# Patient Record
Sex: Male | Born: 1945 | Race: White | Hispanic: No | Marital: Married | Smoking: Former smoker
Health system: Southern US, Community
[De-identification: ages and names within clinical notes are randomized; demographics above are authoritative.]

## PROBLEM LIST (undated history)

## (undated) DIAGNOSIS — R9439 Abnormal result of other cardiovascular function study: Secondary | ICD-10-CM

## (undated) DIAGNOSIS — E119 Type 2 diabetes mellitus without complications: Secondary | ICD-10-CM

## (undated) DIAGNOSIS — F419 Anxiety disorder, unspecified: Secondary | ICD-10-CM

## (undated) DIAGNOSIS — E782 Mixed hyperlipidemia: Secondary | ICD-10-CM

## (undated) DIAGNOSIS — G08 Intracranial and intraspinal phlebitis and thrombophlebitis: Secondary | ICD-10-CM

## (undated) DIAGNOSIS — G8929 Other chronic pain: Secondary | ICD-10-CM

## (undated) DIAGNOSIS — Z87448 Personal history of other diseases of urinary system: Secondary | ICD-10-CM

## (undated) DIAGNOSIS — I639 Cerebral infarction, unspecified: Secondary | ICD-10-CM

## (undated) HISTORY — DX: Cerebral infarction, unspecified: I63.9

## (undated) HISTORY — PX: COLONOSCOPY: SHX174

## (undated) HISTORY — DX: Personal history of other diseases of urinary system: Z87.448

## (undated) HISTORY — DX: Other chronic pain: G89.29

## (undated) HISTORY — DX: Type 2 diabetes mellitus without complications: E11.9

## (undated) HISTORY — DX: Anxiety disorder, unspecified: F41.9

## (undated) HISTORY — DX: Mixed hyperlipidemia: E78.2

## (undated) HISTORY — DX: Intracranial and intraspinal phlebitis and thrombophlebitis: G08

## (undated) HISTORY — DX: Abnormal result of other cardiovascular function study: R94.39

---

## 2004-08-01 HISTORY — PX: OTHER SURGICAL HISTORY: SHX169

## 2011-08-03 ENCOUNTER — Encounter: Payer: Self-pay | Admitting: Family Medicine

## 2011-10-05 ENCOUNTER — Encounter: Payer: Self-pay | Admitting: Family Medicine

## 2011-10-05 DIAGNOSIS — R079 Chest pain, unspecified: Secondary | ICD-10-CM

## 2011-11-14 ENCOUNTER — Encounter: Payer: Self-pay | Admitting: Family Medicine

## 2011-11-14 DIAGNOSIS — R55 Syncope and collapse: Secondary | ICD-10-CM

## 2011-11-22 ENCOUNTER — Encounter: Payer: Self-pay | Admitting: *Deleted

## 2011-12-08 ENCOUNTER — Encounter: Payer: Self-pay | Admitting: Cardiology

## 2011-12-09 ENCOUNTER — Encounter: Payer: Self-pay | Admitting: Cardiology

## 2011-12-09 ENCOUNTER — Ambulatory Visit (INDEPENDENT_AMBULATORY_CARE_PROVIDER_SITE_OTHER): Payer: Medicare PPO | Admitting: Cardiology

## 2011-12-09 VITALS — BP 113/74 | HR 84 | Ht 72.0 in | Wt 197.0 lb

## 2011-12-09 DIAGNOSIS — E119 Type 2 diabetes mellitus without complications: Secondary | ICD-10-CM | POA: Insufficient documentation

## 2011-12-09 DIAGNOSIS — E782 Mixed hyperlipidemia: Secondary | ICD-10-CM

## 2011-12-09 DIAGNOSIS — Z8673 Personal history of transient ischemic attack (TIA), and cerebral infarction without residual deficits: Secondary | ICD-10-CM

## 2011-12-09 DIAGNOSIS — I2510 Atherosclerotic heart disease of native coronary artery without angina pectoris: Secondary | ICD-10-CM

## 2011-12-09 DIAGNOSIS — Z136 Encounter for screening for cardiovascular disorders: Secondary | ICD-10-CM

## 2011-12-09 MED ORDER — NITROGLYCERIN 0.4 MG SL SUBL: 0.4000 mg | SUBLINGUAL_TABLET | SUBLINGUAL | Status: AC | PRN

## 2011-12-09 NOTE — Assessment & Plan Note (Signed)
Diagnosis based on abnormal Myoview showing lateral ischemia although normal LVEF. In the absence of progressive angina or shortness of breath, our plan is to continue observation with risk factor modification. Aspirin as not being added to his current Coumadin with history of intermittent hematuria. We are providing a prescription for p.r.n. Nitroglycerin. Otherwise focus on glucose and lipid management with Dr. Leandrew Koyanagi. Follow up arranged in 6 months, sooner if needed.

## 2011-12-09 NOTE — Patient Instructions (Addendum)
Your physician wants you to follow-up in: 6 months. You will receive a reminder letter in the mail one-two months in advance. If you don't receive a letter, please call our office to schedule the follow-up appointment. Your physician recommends that you continue on your current medications as directed. Please refer to the Current Medication list given to you today. Use Nitroglycerin as needed for chest pain--Place one tablet under tongue every 5 minutes up to 3 doses as needed for chest pain. No more than 3 doses over a 15 minute period.

## 2011-12-09 NOTE — Assessment & Plan Note (Signed)
Details not clear. Patient reports recurrent strokes over time, necessitating chronic Coumadin.

## 2011-12-09 NOTE — Assessment & Plan Note (Signed)
Keep follow up with Dr. Leandrew Koyanagi, aim for hemoglobin A1c at least under 7.

## 2011-12-09 NOTE — Assessment & Plan Note (Signed)
Continue on statin therapy, goal LDL should be under 100.

## 2011-12-09 NOTE — Progress Notes (Signed)
Clinical Summary  Mr. Joel Santos is a 66 y.o.male referred by Dr. Leandrew Koyanagi for cardiology consultation. Recent testing reviewed with patient today. He denies any clear-cut angina or progressive shortness of breath. States he has had some trouble with dizziness and reported syncopal events, evaluated earlier in the year. Feels generally fatigued.  Echocardiogram from March demonstrated LVEF of 60-65% with diastolic dysfunction, mild biatrial enlargement, mild mitral regurgitation, mild pulmonary hypertension with RVSP 40-45 mmHg, mildly dilated ascending aortic root. Carotid Dopplers from March showed only mild plaque with no hemodynamically significant stenoses. Myoview from March as well indicated no diagnostic ST segment changes with Lexiscan, and evidence of probable ischemia in the lateral wall.  We discussed the likelihood that he has underlying CAD based on this testing. Also discussed importance of good control of his diabetes and lipid status. He reports having some occasional hematuria on Coumadin, and therefore we felt it best not to add aspirin to his regimen. Discussed options of continuing medical therapy and observation, versus pursuing cardiac catheterization if his symptoms were to progress. This would necessitate discontinuation of Coumadin at least in the short-term, and would have a higher risk of stroke in light of his history. For now he was most comfortable with observation.   No Known Allergies  Current Outpatient Prescriptions  Medication Sig Dispense Refill  . diazepam (VALIUM) 5 MG tablet Take 5 mg by mouth 2 (two) times daily.      Marland Kitchen gabapentin (NEURONTIN) 800 MG tablet Take 800 mg by mouth 4 (four) times daily.      Marland Kitchen glipiZIDE (GLUCOTROL) 5 MG tablet Take 2.5 mg by mouth 2 (two) times daily before a meal.      . mirtazapine (REMERON) 30 MG tablet Take 15 mg by mouth at bedtime.      Marland Kitchen oxyCODONE-acetaminophen (PERCOCET) 5-325 MG per tablet Take 1 tablet by mouth every 6  (six) hours as needed.      . simvastatin (ZOCOR) 80 MG tablet Take 40 mg by mouth at bedtime.      Marland Kitchen venlafaxine XR (EFFEXOR-XR) 150 MG 24 hr capsule Take 300 mg by mouth daily.      Marland Kitchen warfarin (COUMADIN) 5 MG tablet Take 5 mg by mouth as directed.      . nitroGLYCERIN (NITROSTAT) 0.4 MG SL tablet Place 1 tablet (0.4 mg total) under the tongue every 5 (five) minutes as needed for chest pain.  25 tablet  3    Past Medical History  Diagnosis Date  . Mixed hyperlipidemia   . Stroke     Recurrent by report - although head MRI negative 2009  . Type 2 diabetes mellitus   . Anxiety   . Chronic pain   . Cerebral venous thrombosis     MR venogram with patent dural sinuses 2009    Past Surgical History  Procedure Date  . Arm surgery 2006    Family History  Problem Relation Age of Onset  . Cancer Mother   . Diabetes Mother   . Coronary artery disease Mother     MI age 42    Social History Mr. Joel Santos reports that he quit smoking about 16 years ago. His smoking use included Cigarettes. He has a 90 pack-year smoking history. He quit smokeless tobacco use about 13 years ago. His smokeless tobacco use included Chew. Mr. Joel Santos reports that he does not drink alcohol.  Review of Systems No palpitations, no recent syncope. Stable appetite. No orthopnea or lower extremity edema. Otherwise  negative except as outlined.  Physical Examination Filed Vitals:   12/09/11 1410  BP: 113/74  Pulse: 84   Overweight male in no acute distress. HEENT: Conjunctiva and lids normal, oropharynx clear. Neck: Supple, no elevated JVP, soft left carotid bruit, no thyromegaly. Lungs: Clear to auscultation, nonlabored breathing at rest. Cardiac: Regular rate and rhythm, no S3 or significant systolic murmur, no pericardial rub. Abdomen: Soft, nontender, bowel sounds present, no guarding or rebound. Extremities: No pitting edema, distal pulses 1-2+. Right hand deformity after prior injury. Skin: Warm and  dry. Musculoskeletal: No kyphosis. Neuropsychiatric: Alert and oriented x3, affect grossly appropriate.   ECG Normal sinus rhythm at 79.   Problem List and Plan   Coronary atherosclerosis of native coronary artery Diagnosis based on abnormal Myoview showing lateral ischemia although normal LVEF. In the absence of progressive angina or shortness of breath, our plan is to continue observation with risk factor modification. Aspirin as not being added to his current Coumadin with history of intermittent hematuria. We are providing a prescription for p.r.n. Nitroglycerin. Otherwise focus on glucose and lipid management with Dr. Leandrew Koyanagi. Follow up arranged in 6 months, sooner if needed.  Mixed hyperlipidemia Continue on statin therapy, goal LDL should be under 100.  Type 2 diabetes mellitus Keep follow up with Dr. Leandrew Koyanagi, aim for hemoglobin A1c at least under 7.  History of stroke Details not clear. Patient reports recurrent strokes over time, necessitating chronic Coumadin.     Jonelle Sidle, M.D., F.A.C.C.

## 2015-04-01 ENCOUNTER — Encounter: Payer: Self-pay | Admitting: Internal Medicine

## 2015-04-23 ENCOUNTER — Ambulatory Visit: Payer: Medicare PPO | Admitting: Nurse Practitioner

## 2015-05-07 ENCOUNTER — Ambulatory Visit (INDEPENDENT_AMBULATORY_CARE_PROVIDER_SITE_OTHER): Payer: Medicare PPO | Admitting: Nurse Practitioner

## 2015-05-07 ENCOUNTER — Encounter: Payer: Self-pay | Admitting: Nurse Practitioner

## 2015-05-07 VITALS — BP 113/72 | HR 72 | Temp 97.3°F | Ht 73.0 in | Wt 173.6 lb

## 2015-05-07 DIAGNOSIS — K5900 Constipation, unspecified: Secondary | ICD-10-CM | POA: Insufficient documentation

## 2015-05-07 DIAGNOSIS — R1084 Generalized abdominal pain: Secondary | ICD-10-CM

## 2015-05-07 DIAGNOSIS — R109 Unspecified abdominal pain: Secondary | ICD-10-CM | POA: Insufficient documentation

## 2015-05-07 MED ORDER — LUBIPROSTONE 24 MCG PO CAPS
24.0000 ug | ORAL_CAPSULE | Freq: Two times a day (BID) | ORAL | Status: AC
Start: 2015-05-07 — End: ?

## 2015-05-07 NOTE — Progress Notes (Signed)
Primary Care Physician:  Juliette Alcide, MD Primary Gastroenterologist:  Dr. Jena Gauss  Chief Complaint  Patient presents with  . Abdominal Pain    HPI:   69 year old male presents on referral from PCP for abdominal pain. PCP notes reviewed. PCP visit dated 03/23/2015 for patient with a history of abdominal pain, seen in the ER 01/15/2015. Pain was described as upper half abdomen worse in the bilateral flanks, intermittent, worse with eating. Also noted hematochezia on the paper and streaked on stool. Also with weakness and dizziness. Abdominal ultrasound completed 03/26/2015 with no acute findings, hepatic cysts also identified on CT 1 month prior, gallbladder normal, no bile duct dilation. Multiple labs ordered with normal lipase, normal amylase, normal CMP, normal CBC with hemoglobin of 13.2. No CT scan results provided, none found in our system. Colonoscopy report received from [facility] and was completed on 01/14/2013. Polypectomy 2, internal hemorrhoids noted. Surgical pathology showed hyperplastic polyp. Recommended repeat in 5 years  Today he states his pain is periumbilical, upper abdomen, and bilateral flanks. Pain has been occurring for 3 years and is persistent but no worse. Admits occasional/rare N/V. Has been taking Miralax prn constipation. Pain is burning/irritating pain, constant unless he take one of his pain pills. Has a bowel movement about every 2-3 days, stools are hard and require straining. Post-bowel movement sensation of incomplete emptying. Pain worse after eating, improves after a bowel movement. Has had scant toilet-tissue hematochezia once in the past month when having a particularly bad episode of constipation. Denies chest pain, dizziness, lightheadedness, syncope, near syncope. Denies any other upper or lower GI symptoms.  Past Medical History  Diagnosis Date  . Mixed hyperlipidemia   . Stroke [facility])     Recurrent by report - although head MRI negative  2009  . Type 2 diabetes mellitus (HCC)   . Anxiety   . Chronic pain   . Cerebral venous thrombosis     MR venogram with patent dural sinuses 2009    Past Surgical History  Procedure Laterality Date  . Arm surgery  2006  . Colonoscopy  about 3 yrs ago    removed 2 cyst    Current Outpatient Prescriptions  Medication Sig Dispense Refill  . atorvastatin (LIPITOR) 20 MG tablet Take 20 mg by mouth daily.    . diazepam (VALIUM) 5 MG tablet Take 5 mg by mouth 2 (two) times daily.    Marland Kitchen gabapentin (NEURONTIN) 800 MG tablet Take 800 mg by mouth 4 (four) times daily.    Marland Kitchen glipiZIDE (GLUCOTROL) 5 MG tablet Take 2.5 mg by mouth 2 (two) times daily before a meal.    . lisinopril (PRINIVIL,ZESTRIL) 10 MG tablet Take 10 mg by mouth daily.    . mirtazapine (REMERON) 30 MG tablet Take 15 mg by mouth at bedtime.    Marland Kitchen oxyCODONE-acetaminophen (PERCOCET) 5-325 MG per tablet Take 1 tablet by mouth every 6 (six) hours as needed.    . simvastatin (ZOCOR) 80 MG tablet Take 40 mg by mouth at bedtime.    . sulfamethoxazole-trimethoprim (BACTRIM DS,SEPTRA DS) 800-160 MG tablet TK 1 T PO BID  0  . venlafaxine XR (EFFEXOR-XR) 150 MG 24 hr capsule Take 300 mg by mouth daily.    Marland Kitchen warfarin (COUMADIN) 5 MG tablet Take 5 mg by mouth as directed.    . nitroGLYCERIN (NITROSTAT) 0.4 MG SL tablet Place 1 tablet (0.4 mg total) under the tongue every 5 (five) minutes as needed for chest pain. 25 tablet  3   No current facility-administered medications for this visit.    Allergies as of 05/07/2015  . (No Known Allergies)    Family History  Problem Relation Age of Onset  . Cancer Mother   . Diabetes Mother   . Coronary artery disease Mother     MI age 53    Social History   Social History  . Marital Status: Married    Spouse Name: N/A  . Number of Children: N/A  . Years of Education: N/A   Occupational History  . Not on file.   Social History Main Topics  . Smoking status: Former Smoker -- 3.00  packs/day for 30 years    Types: Cigarettes    Quit date: 08/02/1995  . Smokeless tobacco: Former Neurosurgeon    Types: Chew    Quit date: 08/01/1998     Comment: chewed for three years  . Alcohol Use: No  . Drug Use: No  . Sexual Activity: Not on file   Other Topics Concern  . Not on file   Social History Narrative    Review of Systems: General: Negative for anorexia, weight loss, fever, chills, fatigue, weakness. Eyes: Negative for vision changes.  ENT: Negative for hoarseness, difficulty swallowing. CV: Negative for chest pain, angina, palpitations, peripheral edema.  Respiratory: Negative for dyspnea at rest, cough, sputum, wheezing.  GI: See history of present illness. Derm: Negative for rash or itching.  Endo: Negative for unusual weight change.  Heme: Negative for bruising or bleeding. Allergy: Negative for rash or hives.    Physical Exam: BP 113/72 mmHg  Pulse 72  Temp(Src) 97.3 F (36.3 C) (Oral)  Ht  (1.854 m)  Wt 173 lb 9.6 oz (78.744 kg)  BMI 22.91 kg/m2 General:   Alert and oriented. Pleasant and cooperative. Well-nourished and well-developed.  Head:  Normocephalic and atraumatic. Eyes:  Without icterus, sclera clear and conjunctiva pink.  Ears:  Normal auditory acuity. Cardiovascular:  S1, S2 present without murmurs appreciated. Extremities without clubbing or edema. Respiratory:  Clear to auscultation bilaterally. No wheezes, rales, or rhonchi. No distress.  Gastrointestinal:  +BS, soft, and non-distended. Mild generalized abdominal discomfort. No HSM noted. No guarding or rebound. No masses appreciated.  Rectal:  Deferred  Neurologic:  Alert and oriented x4;  grossly normal neurologically. Psych:  Alert and cooperative. Normal mood and affect. Heme/Lymph/Immune: No excessive bruising noted.    05/07/2015 9:46 AM

## 2015-05-07 NOTE — Patient Instructions (Signed)
1. Have your x-ray done. 2. Start taking Amitiza 24 g, one tab twice daily. Take it with food to prevent nausea side effect. 3. We will try to provide you with samples for 2-3 weeks. 4. Call with the results and if the Amitiza is working with her constipation. Tell us if your having more frequent bowel movements, feel like your emptying completely, and if your abdominal pain is any better. 5. Return for follow-up in 6 weeks.

## 2015-05-08 NOTE — Assessment & Plan Note (Signed)
Constipation symptoms noted in addition to his abdominal pain. His symptoms include a bowel movement every 2-3 days, hard stools, straining required, and sensation of incomplete emptying. MiraLAX is been ineffective and adequately controlling his symptoms. We'll trial him on Amitiza as noted below. Return for follow-up in 6 weeks.

## 2015-05-08 NOTE — Assessment & Plan Note (Addendum)
Patient with chronic generalized abdominal pain over the past 3 years. Pain is constant. He sees pain management. Tends to manage his pain adequately. However, his pain is worse after eating and is also associated with constipation-like symptoms as noted above. Pain is somewhat improved after bowel movement. 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 MiraLAX is Inadequate for his symptoms. Today I will order a flat plate abdomen to evaluate for stool burden. We'll also trial him on Amitiza 24 g twice a day to be taken with food, we'll provide samples for 2-3 weeks. He agrees to call us in 2-3 weeks with the results of the Amitiza including frequency of stools, stool consistency, and status of his abdominal pain. Return for follow-up in 6 weeks for further evaluation.

## 2015-05-11 NOTE — Progress Notes (Signed)
CC'D TO PCP °

## 2015-06-23 ENCOUNTER — Ambulatory Visit: Payer: Medicare PPO | Admitting: Nurse Practitioner

## 2016-02-04 ENCOUNTER — Encounter: Payer: Self-pay | Admitting: Cardiology

## 2016-02-04 ENCOUNTER — Encounter: Payer: Self-pay | Admitting: *Deleted

## 2016-02-04 ENCOUNTER — Ambulatory Visit (INDEPENDENT_AMBULATORY_CARE_PROVIDER_SITE_OTHER): Payer: Medicare PPO | Admitting: Cardiology

## 2016-02-04 VITALS — BP 92/54 | HR 76 | Ht 73.0 in | Wt 172.0 lb

## 2016-02-04 DIAGNOSIS — I25119 Atherosclerotic heart disease of native coronary artery with unspecified angina pectoris: Secondary | ICD-10-CM | POA: Diagnosis not present

## 2016-02-04 DIAGNOSIS — R0602 Shortness of breath: Secondary | ICD-10-CM

## 2016-02-04 DIAGNOSIS — R42 Dizziness and giddiness: Secondary | ICD-10-CM | POA: Diagnosis not present

## 2016-02-04 DIAGNOSIS — E785 Hyperlipidemia, unspecified: Secondary | ICD-10-CM

## 2016-02-04 DIAGNOSIS — Z8673 Personal history of transient ischemic attack (TIA), and cerebral infarction without residual deficits: Secondary | ICD-10-CM

## 2016-02-04 MED ORDER — LISINOPRIL 5 MG PO TABS: 5.0000 mg | ORAL_TABLET | Freq: Every day | ORAL | Status: DC

## 2016-02-04 NOTE — Progress Notes (Signed)
Cardiology Office Note  Date: 02/04/2016   ID: Joel SitesJackie Morella, DOB [birth year], MRN [id number]  PCP: Juliette AlcideBURDINE,STEVEN E, MD  Consulting Cardiologist: Nona DellSamuel Shavontae Gibeault, MD   Chief Complaint  Patient presents with  . Cardiac evaluation  . History of abnormal Cardiolite    History of Present Illness: Joel Santos is a 70 y.o. male referred back to the office by Dr. Leandrew KoyanagiBurdine, he was last seen in 2013. I reviewed his records. He presents with a fairly chronic history of dyspnea on exertion. Does not report any palpitations or exertional chest pain. He had a [facility] May reporting LVEF 55-60% with abnormal diastolic function (ungraded), mild left atrial enlargement, thickened aortic valve without stenosis, RVSP 49 mmHg. Reports occasional cough, nonproductive. No fevers or chills. Feels mildly dizzy sometimes when he stands.  He is on chronic Coumadin, followed by Dr. Leandrew KoyanagiBurdine with history of recurrent stroke by report although information is not entirely clear. There is mention of atrial fibrillation in Dr. Cato MulliganBurdine's note from May of this year, although I do not see this rhythm clearly documented based on his ECGs.  He underwent previous ischemic testing in 2013 with possible lateral ischemia by Cardiolite study.  I reviewed his medications which are outlined below.  Past Medical History  Diagnosis Date  . Mixed hyperlipidemia   . Stroke [facility](HCC)     Recurrent by report - although head MRI negative 2009  . Type 2 diabetes mellitus (HCC)   . Anxiety   . Chronic pain   . Cerebral venous thrombosis     MR venogram with patent dural sinuses 2009  . Abnormal myocardial perfusion study     Possible lateral ischemia March 2013  . History of hematuria     Cystoscopy - follows with Urology    Past Surgical History  Procedure Laterality Date  . Arm surgery  2006  . Colonoscopy      Current Outpatient Prescriptions  Medication Sig Dispense Refill  .  atorvastatin (LIPITOR) 20 MG tablet Take 20 mg by mouth daily.    . diazepam (VALIUM) 5 MG tablet Take 5 mg by mouth 2 (two) times daily.    Marland Kitchen. gabapentin (NEURONTIN) 800 MG tablet Take 800 mg by mouth 4 (four) times daily.    Marland Kitchen. glipiZIDE (GLUCOTROL) 5 MG tablet Take 2.5 mg by mouth 2 (two) times daily before a meal.    . lisinopril (PRINIVIL,ZESTRIL) 10 MG tablet Take 10 mg by mouth daily.    Marland Kitchen. lubiprostone (AMITIZA) 24 MCG capsule Take 1 capsule (24 mcg total) by mouth 2 (two) times daily with a meal. 42 capsule 0  . mirtazapine (REMERON) 30 MG tablet Take 15 mg by mouth at bedtime.    . nitroGLYCERIN (NITROSTAT) 0.4 MG SL tablet Place 1 tablet (0.4 mg total) under the tongue every 5 (five) minutes as needed for chest pain. 25 tablet 3  . oxyCODONE-acetaminophen (PERCOCET) 5-325 MG per tablet Take 1 tablet by mouth every 6 (six) hours as needed.    . venlafaxine XR (EFFEXOR-XR) 150 MG 24 hr capsule Take 300 mg by mouth daily.    Marland Kitchen. warfarin (COUMADIN) 5 MG tablet Take 5 mg by mouth as directed.     No current facility-administered medications for this visit.   Allergies:  Review of patient's allergies indicates no known allergies.   Social History: The patient  reports that he quit smoking about 20 years ago. His smoking use included Cigarettes. He has a  90 pack-year smoking history. He quit smokeless tobacco use about 17 years ago. His smokeless tobacco use included Chew. He reports that he does not drink alcohol or use illicit drugs.   Family History: The patient's family history includes Cancer in his mother; Coronary artery disease in his mother; Diabetes in his mother.   ROS:  Please see the history of present illness. Otherwise, complete review of systems is positive for recurrent hematuria.  All other systems are reviewed and negative.   Physical Exam: VS:  BP 92/54 mmHg  Pulse 76  Ht 6\' 1"  (1.854 m)  Wt 172 lb (78.019 kg)  BMI 22.70 kg/m2  SpO2 98%, BMI Body mass index is 22.7  kg/(m^2).  Wt Readings from Last 3 Encounters:  02/04/16 172 lb (78.019 kg)  05/07/15 173 lb 9.6 oz (78.744 kg)  12/09/11 197 lb (89.359 kg)    General: Patient appears comfortable at rest. HEENT: Conjunctiva and lids normal, oropharynx clear. Neck: Supple, no elevated JVP or carotid bruits, no thyromegaly. Lungs: "Dry" crackles at the left base and also anteriorly, nonlabored breathing at rest. Cardiac: Regular rate and rhythm, no S3 or significant systolic murmur, no pericardial rub. Abdomen: Soft, nontender, bowel sounds present, no guarding or rebound. Extremities: No pitting edema, distal pulses 2+. Skin: Warm and dry. Musculoskeletal: No kyphosis. Neuropsychiatric: Alert and oriented x3, affect grossly appropriate.  ECG: I personally reviewed the tracing from 12/08/2015 which showed sinus rhythm.  Recent Labwork:  May 2017: BUN 10, creatinine 0.9, potassium 4.7, AST 19, ALT 12, hemoglobin 13.7, platelets 213  Other Studies Reviewed Today:  Echocardiogram March 2013: LVEF of 60-65% with diastolic dysfunction, mild biatrial enlargement, mild mitral regurgitation, mild pulmonary hypertension with RVSP 40-45 mmHg, mildly dilated ascending aortic root.  Myoview March 2013: No diagnostic ST segment changes with Lexiscan, and evidence of probable ischemia in the lateral wall.  Assessment and Plan:  1. Chronic dyspnea on exertion. Recent echocardiogram noted with preserved LVEF, ungraded diastolic dysfunction, and RVSP 49 mmHg. He does not have any reported history of chronic lung disease, but abnormal findings on examination suggesting possible fibrotic changes. We will obtain a PA and lateral chest x-ray. Also from an ischemic perspective, a Lexiscan Cardiolite will be obtained to reassess ischemic burden in comparison to 2013.  2. Orthostatic dizziness, systolic blood pressure in the 90s today. I asked him to reduce lisinopril to 5 mg daily to see if this helps.  3. Reported  history of recurrent strokes in years past, on chronic Coumadin per Dr. Leandrew KoyanagiBurdine. I do not see any definitive documentation of atrial fibrillation. Heart rate is regular today and his most recent ECG showed sinus rhythm.  4. History of hyperlipidemia, on Lipitor.  Current medicines were reviewed with the patient today.   Orders Placed This Encounter  Procedures  . DG Chest 2 View  . NM Myocar Multi W/Spect W/Wall Motion / EF  . Myocardial Perfusion Imaging    Disposition: Schedule routine follow-up in one year. Will call with test results first.  Signed, Jonelle SidleSamuel G. Raziyah Vanvleck, MD, [facility]ACC 02/04/2016 8:41 AM    [facility] [address], [place], [place] [address] Phone: [phone]; Fax: [phone]

## 2016-02-04 NOTE — Patient Instructions (Addendum)
Medication Instructions:   Decrease Lisinopril to 5mg  daily, may take 1/2 tab of your 10mg  tablet till finish current supply.  New 5mg  table[facility] today and placed on file.  Please call pharmacy when ready for refill.    Continue all other current medications.  Labwork: NONE  Testing/Procedures:  Chest x-ray - order given today.   Your physician has requested that you have a lexiscan myoview. For further information please visit [email or web address]. Please follow instruction sheet, as given.  Office will contact with results via phone or letter.    Follow-Up: Your physician wants you to follow up in:  1 year.  You will receive a reminder letter in the mail one-two months in advance.  If you don't receive a letter, please call our office to schedule the follow up appointment   Any Other Special Instructions Will Be Listed Below (If Applicable).  If you need a refill on your cardiac medications before your next appointment, please call your pharmacy.

## 2016-02-08 ENCOUNTER — Encounter (HOSPITAL_COMMUNITY)
Admission: RE | Admit: 2016-02-08 | Discharge: 2016-02-08 | Disposition: A | Discharge status: Home / Self Care | Payer: Medicare PPO | Source: Ambulatory Visit | Attending: Cardiology | Admitting: Cardiology

## 2016-02-08 ENCOUNTER — Inpatient Hospital Stay (HOSPITAL_COMMUNITY): Admission: RE | Admit: 2016-02-08 | Payer: Medicare PPO | Source: Ambulatory Visit

## 2016-02-08 ENCOUNTER — Encounter (HOSPITAL_COMMUNITY): Payer: Self-pay

## 2016-02-08 DIAGNOSIS — R0602 Shortness of breath: Secondary | ICD-10-CM | POA: Diagnosis not present

## 2016-02-08 DIAGNOSIS — I25119 Atherosclerotic heart disease of native coronary artery with unspecified angina pectoris: Secondary | ICD-10-CM | POA: Insufficient documentation

## 2016-02-08 LAB — NM MYOCAR MULTI W/SPECT W/WALL MOTION / EF
CHL CUP NUCLEAR SDS: 0
CHL CUP NUCLEAR SRS: 0
CHL CUP RESTING HR STRESS: 66 {beats}/min
LV sys vol: 19 mL
LVDIAVOL: 67 mL (ref 62–150)
Peak HR: 88 {beats}/min
RATE: 0.37
SSS: 0
TID: 1.06

## 2016-02-08 MED ORDER — REGADENOSON 0.4 MG/5ML IV SOLN
INTRAVENOUS | Status: AC
  Administered 2016-02-08: 0.4 mg via INTRAVENOUS
  Filled 2016-02-08: qty 5

## 2016-02-08 MED ORDER — TECHNETIUM TC 99M TETROFOSMIN IV KIT
10.0000 | PACK | Freq: Once | INTRAVENOUS | Status: AC | PRN
  Administered 2016-02-08: 10 via INTRAVENOUS

## 2016-02-08 MED ORDER — SODIUM CHLORIDE 0.9% FLUSH
INTRAVENOUS | Status: AC
  Administered 2016-02-08: 10 mL via INTRAVENOUS
  Filled 2016-02-08: qty 10

## 2016-02-08 MED ORDER — TECHNETIUM TC 99M TETROFOSMIN IV KIT
30.0000 | PACK | Freq: Once | INTRAVENOUS | Status: AC | PRN
  Administered 2016-02-08: 30 via INTRAVENOUS

## 2016-02-08 MED ORDER — SODIUM CHLORIDE 0.9% FLUSH
INTRAVENOUS | Status: AC
  Filled 2016-02-08: qty 120

## 2016-02-11 ENCOUNTER — Telehealth: Payer: Self-pay | Admitting: *Deleted

## 2016-02-11 NOTE — Telephone Encounter (Signed)
Patient informed and copy sent to PCP. Patient did not do cxr yet but is aware that he is to have it done also.

## 2016-02-11 NOTE — Telephone Encounter (Signed)
-----   Message from Jonelle SidleSamuel G McDowell, MD sent at 02/09/2016  7:56 AM EDT ----- Results reviewed. Overall reassuring with low risk findings, no active ischemia and normal LVEF. Continue same plan for follow-up in one year. He was also scheduled for a chest x-ray, results pending. A copy of this test should be forwarded to Juliette AlcideBURDINE,STEVEN E, MD.

## 2016-03-08 ENCOUNTER — Encounter: Payer: Self-pay | Admitting: *Deleted

## 2016-12-02 IMAGING — NM NM MYOCAR MULTI W/SPECT W/WALL MOTION & EF
2 series · 12 of 12 positions shown · non-contrast
Comparison: none

[Series 1: rest · 8.28mm/px · 6 of 64 frames shown]
[frame 6/64]
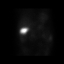
[frame 16/64]
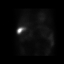
[frame 27/64]
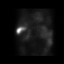
[frame 38/64]
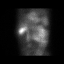
[frame 48/64]
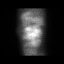
[frame 59/64]
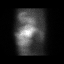

[Series 2: stress gated · 8.28mm/px · 6 of 64 frames shown]
[frame 6/64]
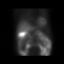
[frame 16/64]
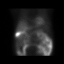
[frame 27/64]
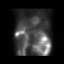
[frame 38/64]
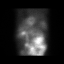
[frame 48/64]
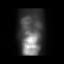
[frame 59/64]
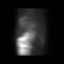

[12 of 12 positions shown; findings below may reference images not displayed]

Canned report from images found in remote index.

Refer to host system for actual result text.

## 2017-03-06 ENCOUNTER — Encounter: Payer: Self-pay | Admitting: *Deleted

## 2017-03-06 NOTE — Progress Notes (Signed)
Cardiology Office Note   Date:  03/07/2017   ID:  Joel Santos, DOB 1946/03/04, MRN [id number]  PCP:  Juliette AlcideBurdine, Steven E, MD  Cardiologist:  Diona BrownerMcDowell   Chief Complaint  Patient presents with  . Cerebrovascular Accident  . Congestive Heart Failure  . Shortness of Breath      History of Present Illness: Joel Santos is a 71 y.o. 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 Leandrew KoyanagiBurdine, seen last by Dr. Diona BrownerMcDowell on 02/04/2016. Other history includes mixed hyperlipidemia, type 2 diabetes, anxiety, and chronic pain. On that last office visit a Lexiscan Cardiolite stress test was ordered, AP and lateral chest x-ray with also ordered  Notes Recorded by Jonelle SidleSamuel G McDowell, MD on 03/11/2016 at 7:05 AM EDT CXR abnormal suggesting interstitial lung disease (diffuse fibrosis). This should be further evaluated. Patient's stress test looked good, so his dyspnea may well be pulmonary in etiology. Please send copy to Dr. Leandrew KoyanagiBurdine and speak with his nurse to make sure he gets this. Suggest chest CT and PFTs with likely referral to Pulmonary - would have Dr. Leandrew KoyanagiBurdine arrange this as he sees fit.  NM Stress Test 02/08/2016 Study Result    The study is normal.  This is a low risk study.  The left ventricular ejection fraction is hyperdynamic (>65%).  There was no ST segment deviation noted during stress.     He comes today feeling about the same. [facility] and is [facility]2 at all times. He has been diagnosed with asbestosis. He continues to have DOE, and is easily fatigued. He complaints of generalized weakness. Dr. Leandrew KoyanagiBurdine manages his coumadin dosing.   Past Medical History:  Diagnosis Date  . Abnormal myocardial perfusion study    Possible lateral ischemia March 2013  . Anxiety   . Cerebral venous thrombosis    MR venogram with patent dural sinuses 2009  . Chronic pain   .  History of hematuria    Cystoscopy - follows with Urology  . Mixed hyperlipidemia   . Stroke [facility](HCC)    Recurrent by report - although head MRI negative 2009  . Type 2 diabetes mellitus (HCC)     Past Surgical History:  Procedure Laterality Date  . Arm surgery  2006  . COLONOSCOPY       Current Outpatient Prescriptions  Medication Sig Dispense Refill  . atorvastatin (LIPITOR) 20 MG tablet Take 20 mg by mouth daily.    . diazepam (VALIUM) 5 MG tablet Take 5 mg by mouth 2 (two) times daily.    Marland Kitchen. gabapentin (NEURONTIN) 800 MG tablet Take 800 mg by mouth 4 (four) times daily.    Marland Kitchen. glipiZIDE (GLUCOTROL) 5 MG tablet Take 2.5 mg by mouth 2 (two) times daily before a meal.    . lisinopril (PRINIVIL,ZESTRIL) 5 MG tablet Take 1 tablet (5 mg total) by mouth daily. 90 tablet 3  . lubiprostone (AMITIZA) 24 MCG capsule Take 1 capsule (24 mcg total) by mouth 2 (two) times daily with a meal. 42 capsule 0  . nitroGLYCERIN (NITROSTAT) 0.4 MG SL tablet Place 1 tablet (0.4 mg total) under the tongue every 5 (five) minutes as needed for chest pain. 25 tablet 3  . oxyCODONE-acetaminophen (PERCOCET) 5-325 MG per tablet Take 1 tablet by mouth every 6 (six) hours as needed.    . venlafaxine XR (EFFEXOR-XR) 150 MG 24 hr capsule Take 300 mg by  mouth daily.    Marland Kitchen warfarin (COUMADIN) 5 MG tablet Take 5 mg by mouth as directed.    . mirtazapine (REMERON) 30 MG tablet Take 15 mg by mouth at bedtime.     No current facility-administered medications for this visit.     Allergies:   Patient has no known allergies.    Social History:  The patient  reports that he quit smoking about 21 years ago. His smoking use included Cigarettes. He has a 90.00 pack-year smoking history. His smokeless tobacco use includes Chew. He reports that he does not drink alcohol or use drugs.   Family History:  The patient's family history includes Cancer in his mother; Coronary artery disease in his mother; Diabetes in his mother.     ROS: All other systems are reviewed and negative. Unless otherwise mentioned in H&P    PHYSICAL EXAM: VS:  BP 110/78   Pulse 98   Ht 6\' 1"  (1.854 m)   Wt 179 lb (81.2 kg)   SpO2 96%   BMI 23.62 kg/m  , BMI Body mass index is 23.62 kg/m. GEN: Well nourished, well developed, in no acute distress  HEENT: normal  Neck: no JVD, carotid bruits, or masses Cardiac: RRR; no murmurs, rubs, or gallops,no edema  Respiratory:  clear to auscultation bilaterally, normal work of breathing GI: soft, nontender, nondistended, + BS MS: no deformity or atrophy  Skin: warm and dry, no rash Neuro:  Strength and sensation are intact Psych: euthymic mood, full affect   EKG:  SR, Sinus tachycardia, rate in the 90's.   Recent Labs: No results found for requested labs within last 8760 hours.    Lipid Panel No results found for: CHOL, TRIG, HDL, CHOLHDL, VLDL, LDLCALC, LDLDIRECT    Wt Readings from Last 3 Encounters:  03/07/17 179 lb (81.2 kg)  02/04/16 172 lb (78 kg)  05/07/15 173 lb 9.6 oz (78.7 kg)        ASSESSMENT AND PLAN:  1. Chronic Dyspnea; Based upon stress test, not cardiac in etiology. He has some chest discomfort usually with difficulty breathing. He will continue current regimen. No plans for further ischemic testing. [facility] and [facility] support.   2. Hypertension: He is well controlled with the addition of lisinopril. Can consider changing to CCB to assist with pulmonary hypertension and tachycardia if clinically necessary. For now will not make any changes. He is slightly tachycardic at rest.   3. CVA: Continues on coumadin therapy. Dosing per PCP, Dr. Leandrew Koyanagi.   4. Hypercholesterolemia: Continue statin therapy.     Current medicines are reviewed at length with the patient today.    Labs/ tests ordered today include:  Bettey Mare. Liborio Nixon, ANP, AACC   03/07/2017 2:12 PM    [facility] [address]  [place], [place] [address] Phone: [phone]; Fax: [phone]

## 2017-03-07 ENCOUNTER — Ambulatory Visit (INDEPENDENT_AMBULATORY_CARE_PROVIDER_SITE_OTHER): Payer: Medicare PPO | Admitting: Adult Health

## 2017-03-07 ENCOUNTER — Encounter: Payer: Self-pay | Admitting: Adult Health

## 2017-03-07 VITALS — BP 110/78 | HR 98 | Ht 73.0 in | Wt 179.0 lb

## 2017-03-07 DIAGNOSIS — J431 Panlobular emphysema: Secondary | ICD-10-CM

## 2017-03-07 DIAGNOSIS — I10 Essential (primary) hypertension: Secondary | ICD-10-CM

## 2017-03-07 DIAGNOSIS — E7800 Pure hypercholesterolemia, unspecified: Secondary | ICD-10-CM | POA: Diagnosis not present

## 2017-03-07 DIAGNOSIS — Z8673 Personal history of transient ischemic attack (TIA), and cerebral infarction without residual deficits: Secondary | ICD-10-CM | POA: Diagnosis not present

## 2017-03-07 MED ORDER — LISINOPRIL 5 MG PO TABS: 5.0000 mg | ORAL_TABLET | Freq: Every day | ORAL | 3 refills | Status: DC

## 2017-03-07 NOTE — Patient Instructions (Signed)
Medication Instructions:  Your physician recommends that you continue on your current medications as directed. Please refer to the Current Medication list given to you today.   Labwork: NONE  Testing/Procedures: NONE  Follow-Up: Your physician wants you to follow-up in: 6 Months with Dr. McDowell. You will receive a reminder letter in the mail two months in advance. If you don't receive a letter, please call our office to schedule the follow-up appointment.   Any Other Special Instructions Will Be Listed Below (If Applicable).     If you need a refill on your cardiac medications before your next appointment, please call your pharmacy. Thank you for choosing [facility]!    

## 2017-03-08 ENCOUNTER — Ambulatory Visit: Payer: Medicare PPO | Admitting: Physician Assistant

## 2017-05-20 ENCOUNTER — Inpatient Hospital Stay (HOSPITAL_COMMUNITY)
Admission: AD | Admit: 2017-05-20 | Discharge: 2017-05-26 | DRG: 177 | Disposition: A | Discharge status: Home Health | Payer: Medicare PPO | Source: Other Acute Inpatient Hospital | Attending: Internal Medicine | Admitting: Internal Medicine

## 2017-05-20 DIAGNOSIS — G8929 Other chronic pain: Secondary | ICD-10-CM | POA: Diagnosis present

## 2017-05-20 DIAGNOSIS — E1169 Type 2 diabetes mellitus with other specified complication: Secondary | ICD-10-CM | POA: Diagnosis present

## 2017-05-20 DIAGNOSIS — J84112 Idiopathic pulmonary fibrosis: Secondary | ICD-10-CM | POA: Diagnosis present

## 2017-05-20 DIAGNOSIS — Z77090 Contact with and (suspected) exposure to asbestos: Secondary | ICD-10-CM | POA: Diagnosis present

## 2017-05-20 DIAGNOSIS — F329 Major depressive disorder, single episode, unspecified: Secondary | ICD-10-CM | POA: Diagnosis present

## 2017-05-20 DIAGNOSIS — Z7984 Long term (current) use of oral hypoglycemic drugs: Secondary | ICD-10-CM

## 2017-05-20 DIAGNOSIS — J849 Interstitial pulmonary disease, unspecified: Secondary | ICD-10-CM | POA: Diagnosis present

## 2017-05-20 DIAGNOSIS — B9561 Methicillin susceptible Staphylococcus aureus infection as the cause of diseases classified elsewhere: Secondary | ICD-10-CM | POA: Diagnosis present

## 2017-05-20 DIAGNOSIS — Z86718 Personal history of other venous thrombosis and embolism: Secondary | ICD-10-CM

## 2017-05-20 DIAGNOSIS — R51 Headache: Secondary | ICD-10-CM | POA: Diagnosis present

## 2017-05-20 DIAGNOSIS — R918 Other nonspecific abnormal finding of lung field: Secondary | ICD-10-CM | POA: Diagnosis present

## 2017-05-20 DIAGNOSIS — Z7901 Long term (current) use of anticoagulants: Secondary | ICD-10-CM | POA: Diagnosis not present

## 2017-05-20 DIAGNOSIS — F419 Anxiety disorder, unspecified: Secondary | ICD-10-CM | POA: Diagnosis present

## 2017-05-20 DIAGNOSIS — J15211 Pneumonia due to Methicillin susceptible Staphylococcus aureus: Secondary | ICD-10-CM | POA: Diagnosis present

## 2017-05-20 DIAGNOSIS — Z8673 Personal history of transient ischemic attack (TIA), and cerebral infarction without residual deficits: Secondary | ICD-10-CM | POA: Diagnosis not present

## 2017-05-20 DIAGNOSIS — E440 Moderate protein-calorie malnutrition: Secondary | ICD-10-CM | POA: Insufficient documentation

## 2017-05-20 DIAGNOSIS — J9621 Acute and chronic respiratory failure with hypoxia: Secondary | ICD-10-CM | POA: Diagnosis present

## 2017-05-20 DIAGNOSIS — E876 Hypokalemia: Secondary | ICD-10-CM | POA: Diagnosis present

## 2017-05-20 DIAGNOSIS — I5032 Chronic diastolic (congestive) heart failure: Secondary | ICD-10-CM | POA: Diagnosis present

## 2017-05-20 DIAGNOSIS — R042 Hemoptysis: Secondary | ICD-10-CM | POA: Diagnosis present

## 2017-05-20 DIAGNOSIS — E782 Mixed hyperlipidemia: Secondary | ICD-10-CM | POA: Diagnosis present

## 2017-05-20 DIAGNOSIS — Z6823 Body mass index (BMI) 23.0-23.9, adult: Secondary | ICD-10-CM

## 2017-05-20 DIAGNOSIS — I110 Hypertensive heart disease with heart failure: Secondary | ICD-10-CM | POA: Diagnosis present

## 2017-05-20 DIAGNOSIS — J8410 Pulmonary fibrosis, unspecified: Secondary | ICD-10-CM

## 2017-05-20 DIAGNOSIS — E119 Type 2 diabetes mellitus without complications: Secondary | ICD-10-CM | POA: Diagnosis not present

## 2017-05-20 DIAGNOSIS — Z23 Encounter for immunization: Secondary | ICD-10-CM | POA: Diagnosis present

## 2017-05-20 DIAGNOSIS — Z9229 Personal history of other drug therapy: Secondary | ICD-10-CM

## 2017-05-20 DIAGNOSIS — J9620 Acute and chronic respiratory failure, unspecified whether with hypoxia or hypercapnia: Secondary | ICD-10-CM | POA: Diagnosis present

## 2017-05-20 DIAGNOSIS — I82891 Chronic embolism and thrombosis of other specified veins: Secondary | ICD-10-CM | POA: Diagnosis present

## 2017-05-20 DIAGNOSIS — Z87891 Personal history of nicotine dependence: Secondary | ICD-10-CM

## 2017-05-20 DIAGNOSIS — Z79899 Other long term (current) drug therapy: Secondary | ICD-10-CM

## 2017-05-21 ENCOUNTER — Encounter (HOSPITAL_COMMUNITY): Payer: Self-pay | Admitting: *Deleted

## 2017-05-21 DIAGNOSIS — J9620 Acute and chronic respiratory failure, unspecified whether with hypoxia or hypercapnia: Secondary | ICD-10-CM | POA: Diagnosis present

## 2017-05-21 DIAGNOSIS — J9621 Acute and chronic respiratory failure with hypoxia: Secondary | ICD-10-CM

## 2017-05-21 DIAGNOSIS — R042 Hemoptysis: Secondary | ICD-10-CM

## 2017-05-21 DIAGNOSIS — J8410 Pulmonary fibrosis, unspecified: Secondary | ICD-10-CM | POA: Diagnosis present

## 2017-05-21 DIAGNOSIS — Z9229 Personal history of other drug therapy: Secondary | ICD-10-CM

## 2017-05-21 DIAGNOSIS — E119 Type 2 diabetes mellitus without complications: Secondary | ICD-10-CM

## 2017-05-21 LAB — BASIC METABOLIC PANEL
Anion gap: 8 (ref 5–15)
BUN: 11 mg/dL (ref 6–20)
CALCIUM: 8.5 mg/dL — AB (ref 8.9–10.3)
CO2: 27 mmol/L (ref 22–32)
CREATININE: 0.88 mg/dL (ref 0.61–1.24)
Chloride: 99 mmol/L — ABNORMAL LOW (ref 101–111)
GFR calc Af Amer: >60 mL/min (ref >60)
GFR calc non Af Amer: >60 mL/min (ref >60)
GLUCOSE: 144 mg/dL — AB (ref 65–99)
Potassium: 3.2 mmol/L — ABNORMAL LOW (ref 3.5–5.1)
Sodium: 134 mmol/L — ABNORMAL LOW (ref 135–145)

## 2017-05-21 LAB — CBC
HEMATOCRIT: 37.6 % — AB (ref 39.0–52.0)
HEMOGLOBIN: 12.5 g/dL — AB (ref 13.0–17.0)
MCH: 29.5 pg (ref 26.0–34.0)
MCHC: 33.2 g/dL (ref 30.0–36.0)
MCV: 88.7 fL (ref 78.0–100.0)
Platelets: 207 10*3/uL (ref 150–400)
RBC: 4.24 MIL/uL (ref 4.22–5.81)
RDW: 13.6 % (ref 11.5–15.5)
WBC: 11 10*3/uL — ABNORMAL HIGH (ref 4.0–10.5)

## 2017-05-21 LAB — EXPECTORATED SPUTUM ASSESSMENT W REFEX TO RESP CULTURE

## 2017-05-21 LAB — EXPECTORATED SPUTUM ASSESSMENT W GRAM STAIN, RFLX TO RESP C

## 2017-05-21 LAB — GLUCOSE, CAPILLARY
GLUCOSE-CAPILLARY: 105 mg/dL — AB (ref 65–99)
GLUCOSE-CAPILLARY: 146 mg/dL — AB (ref 65–99)
Glucose-Capillary: 113 mg/dL — ABNORMAL HIGH (ref 65–99)
Glucose-Capillary: 119 mg/dL — ABNORMAL HIGH (ref 65–99)
Glucose-Capillary: 135 mg/dL — ABNORMAL HIGH (ref 65–99)

## 2017-05-21 LAB — PROTIME-INR
INR: 2.62
PROTHROMBIN TIME: 27.8 s — AB (ref 11.4–15.2)

## 2017-05-21 LAB — HIV ANTIBODY (ROUTINE TESTING W REFLEX): HIV Screen 4th Generation wRfx: NONREACTIVE

## 2017-05-21 MED ORDER — ONDANSETRON HCL 4 MG PO TABS: 4.0000 mg | ORAL_TABLET | Freq: Four times a day (QID) | ORAL | Status: DC | PRN

## 2017-05-21 MED ORDER — INSULIN ASPART 100 UNIT/ML ~~LOC~~ SOLN
0.0000 [IU] | Freq: Three times a day (TID) | SUBCUTANEOUS | Status: DC
  Administered 2017-05-21 – 2017-05-25 (×8): 1 [IU] via SUBCUTANEOUS
  Administered 2017-05-26: 2 [IU] via SUBCUTANEOUS

## 2017-05-21 MED ORDER — INFLUENZA VAC SPLIT HIGH-DOSE 0.5 ML IM SUSY
0.5000 mL | PREFILLED_SYRINGE | INTRAMUSCULAR | Status: AC
  Administered 2017-05-22: 0.5 mL via INTRAMUSCULAR
  Filled 2017-05-21: qty 0.5

## 2017-05-21 MED ORDER — ACETAMINOPHEN 325 MG PO TABS
650.0000 mg | ORAL_TABLET | Freq: Four times a day (QID) | ORAL | Status: DC | PRN
  Administered 2017-05-22 – 2017-05-25 (×4): 650 mg via ORAL
  Filled 2017-05-21 (×4): qty 2

## 2017-05-21 MED ORDER — ONDANSETRON HCL 4 MG/2ML IJ SOLN: 4.0000 mg | Freq: Four times a day (QID) | INTRAMUSCULAR | Status: DC | PRN

## 2017-05-21 MED ORDER — ATORVASTATIN CALCIUM 20 MG PO TABS
20.0000 mg | ORAL_TABLET | Freq: Every day | ORAL | Status: DC
  Administered 2017-05-21 – 2017-05-26 (×6): 20 mg via ORAL
  Filled 2017-05-21 (×6): qty 1

## 2017-05-21 MED ORDER — POTASSIUM CHLORIDE CRYS ER 20 MEQ PO TBCR
40.0000 meq | EXTENDED_RELEASE_TABLET | Freq: Once | ORAL | Status: AC
  Administered 2017-05-21: 40 meq via ORAL
  Filled 2017-05-21: qty 2

## 2017-05-21 MED ORDER — DIAZEPAM 5 MG PO TABS
5.0000 mg | ORAL_TABLET | Freq: Two times a day (BID) | ORAL | Status: DC
  Administered 2017-05-21 – 2017-05-26 (×12): 5 mg via ORAL
  Filled 2017-05-21 (×12): qty 1

## 2017-05-21 MED ORDER — LUBIPROSTONE 24 MCG PO CAPS
24.0000 ug | ORAL_CAPSULE | Freq: Two times a day (BID) | ORAL | Status: DC
  Administered 2017-05-21 – 2017-05-26 (×11): 24 ug via ORAL
  Filled 2017-05-21 (×12): qty 1

## 2017-05-21 MED ORDER — VENLAFAXINE HCL ER 75 MG PO CP24
300.0000 mg | ORAL_CAPSULE | Freq: Every day | ORAL | Status: DC
  Administered 2017-05-21 – 2017-05-26 (×6): 300 mg via ORAL
  Filled 2017-05-21 (×6): qty 4

## 2017-05-21 MED ORDER — HYDROCODONE-ACETAMINOPHEN 5-325 MG PO TABS
1.0000 | ORAL_TABLET | Freq: Four times a day (QID) | ORAL | Status: DC | PRN
  Administered 2017-05-21 – 2017-05-24 (×3): 1 via ORAL
  Filled 2017-05-21 (×3): qty 1

## 2017-05-21 MED ORDER — ACETAMINOPHEN 650 MG RE SUPP: 650.0000 mg | Freq: Four times a day (QID) | RECTAL | Status: DC | PRN

## 2017-05-21 MED ORDER — GABAPENTIN 400 MG PO CAPS
800.0000 mg | ORAL_CAPSULE | Freq: Four times a day (QID) | ORAL | Status: DC
  Administered 2017-05-21 – 2017-05-26 (×23): 800 mg via ORAL
  Filled 2017-05-21 (×24): qty 2

## 2017-05-21 MED ORDER — ENSURE ENLIVE PO LIQD
237.0000 mL | Freq: Two times a day (BID) | ORAL | Status: DC
  Administered 2017-05-22 – 2017-05-26 (×9): 237 mL via ORAL

## 2017-05-21 MED ORDER — DEXTROSE 5 % IV SOLN
500.0000 mg | INTRAVENOUS | Status: DC
  Administered 2017-05-21 – 2017-05-22 (×2): 500 mg via INTRAVENOUS
  Filled 2017-05-21 (×2): qty 500

## 2017-05-21 MED ORDER — FUROSEMIDE 10 MG/ML IJ SOLN
20.0000 mg | Freq: Once | INTRAMUSCULAR | Status: AC
  Administered 2017-05-21: 20 mg via INTRAVENOUS
  Filled 2017-05-21: qty 2

## 2017-05-21 MED ORDER — PREDNISONE 20 MG PO TABS: 40.0000 mg | ORAL_TABLET | Freq: Every day | ORAL | Status: DC

## 2017-05-21 MED ORDER — DM-GUAIFENESIN ER 30-600 MG PO TB12
1.0000 | ORAL_TABLET | Freq: Two times a day (BID) | ORAL | Status: DC
Start: 2017-05-21 — End: 2017-05-26
  Administered 2017-05-21 – 2017-05-26 (×11): 1 via ORAL
  Filled 2017-05-21 (×11): qty 1

## 2017-05-21 MED ORDER — OXYCODONE-ACETAMINOPHEN 5-325 MG PO TABS: 1.0000 | ORAL_TABLET | Freq: Four times a day (QID) | ORAL | Status: DC | PRN

## 2017-05-21 MED ORDER — DEXTROSE 5 % IV SOLN
1.0000 g | INTRAVENOUS | Status: DC
  Administered 2017-05-21 – 2017-05-22 (×2): 1 g via INTRAVENOUS
  Filled 2017-05-21 (×2): qty 10

## 2017-05-21 NOTE — Progress Notes (Signed)
PULMONARY / CRITICAL CARE MEDICINE   Name: Joel Santos MRN: [id number] DOB: Jan 21, 1946    ADMISSION DATE:  05/20/2017 CONSULTATION DATE: 05/20/17  REFERRING MD:  Dr Caleb Popp  CHIEF COMPLAINT: hemoptysis  HISTORY OF PRESENT ILLNESS:   71yoM with history of Pulmonary fibrosis (worked up and treated at the [facility] hospital), CVA (2009), Cerebral venous thrombosis (since 2009, on chronic coumadin), HTN, DM, and Diastolic CHF (on TTE 11/2015), presented to an outside hospital on 10/20 c/o hemoptysis. He was transferred to [facility] where Pulmonary consult is now requested. On my exam, patient c/o coughing up dark brownish blood mixed with thick white sputum x 1 day. He says the bloody sputum is approx 1-2tsp at a time and a total of approx 20cc within the past day. He also c/o worsening of his chronic HA's. He admits to SOB which he says is chronic. He reports [facility] that [facility] was nothing that could be done for his pulmonary fibrosis. He says he has never had a lung biopsy. He admits to prior exposure to asbestos. He is not sure what his work-up for his pulmonary fibrosis has included thus far; he is a poor historian.   PAST MEDICAL HISTORY :  He  has a past medical history of Abnormal myocardial perfusion study; Anxiety; Cerebral venous thrombosis; Chronic pain; History of hematuria; Mixed hyperlipidemia; Stroke [facility]); and Type 2 diabetes mellitus (HCC).  PAST SURGICAL HISTORY: He  has a past surgical history that includes Arm surgery (2006) and Colonoscopy.  No Known Allergies  No current facility-administered medications on file prior to encounter.    Current Outpatient Prescriptions on File Prior to Encounter  Medication Sig  . atorvastatin (LIPITOR) 20 MG tablet Take 20 mg by mouth daily.  . diazepam (VALIUM) 5 MG tablet Take 5 mg by mouth 2 (two) times daily.  Marland Kitchen gabapentin (NEURONTIN) 800 MG tablet Take 800 mg by mouth 4 (four) times daily.  Marland Kitchen glipiZIDE (GLUCOTROL) 5 MG  tablet Take 2.5 mg by mouth 2 (two) times daily before a meal.  . lubiprostone (AMITIZA) 24 MCG capsule Take 1 capsule (24 mcg total) by mouth 2 (two) times daily with a meal.  . nitroGLYCERIN (NITROSTAT) 0.4 MG SL tablet Place 1 tablet (0.4 mg total) under the tongue every 5 (five) minutes as needed for chest pain.  Marland Kitchen oxyCODONE-acetaminophen (PERCOCET) 5-325 MG per tablet Take 1 tablet by mouth every 6 (six) hours as needed.  . venlafaxine XR (EFFEXOR-XR) 150 MG 24 hr capsule Take 300 mg by mouth daily.  Marland Kitchen warfarin (COUMADIN) 5 MG tablet Take 5 mg by mouth as directed.   FAMILY HISTORY:  His indicated that the status of his mother is unknown. He indicated that his father is deceased.   SOCIAL HISTORY: He  reports that he quit smoking about 21 years ago. His smoking use included Cigarettes. He has a 90.00 pack-year smoking history. His smokeless tobacco use includes Chew. He reports that he does not drink alcohol or use drugs.  REVIEW OF SYSTEMS:   Review of Systems  Constitutional: Negative.   HENT: Negative.   Eyes: Negative.   Respiratory: Positive for cough, hemoptysis, sputum production and shortness of breath.   Cardiovascular: Negative.  Negative for chest pain.  Gastrointestinal: Negative.   Genitourinary: Negative.   Musculoskeletal: Negative.   Skin: Negative.   Neurological: Positive for headaches.  Endo/Heme/Allergies: Negative.   Psychiatric/Behavioral: Negative.    VITAL SIGNS: BP 103/62 (BP Location: Right Arm)  Pulse 73   Temp 97.8 F (36.6 C) (Oral)   Resp 20   Ht 6\' 1"  (1.854 m)   Wt 82.1 kg (180 lb 14.4 oz)   SpO2 99%   BMI 23.87 kg/m   INTAKE / OUTPUT: I/O last 3 completed shifts: In: 240 [P.O.:240] Out: 2 [Urine:1; Stool:1]  PHYSICAL EXAMINATION: General: WDWN Elderly male, lying in hospital bed in NAD Neuro: AAOx3, moving all extremities HEENT: OP clear, MM moist Cardiovascular: RRR, 3/6 SEM Lungs: Dry inspiratory crackles throughout that is  consistent with his pulmonary fibrosis; no respiratory distress. Speaking in full sentences  Abdomen:  Soft NTND, BS+ Musculoskeletal: trace BLE edema  Skin: no rashes   LABS:  BMET  Recent Labs Lab 05/21/17 0229  NA 134*  K 3.2*  CL 99*  CO2 27  BUN 11  CREATININE 0.88  GLUCOSE 144*   Electrolytes  Recent Labs Lab 05/21/17 0229  CALCIUM 8.5*   CBC  Recent Labs Lab 05/21/17 0229  WBC 11.0*  HGB 12.5*  HCT 37.6*  PLT 207   Coag's No results for input(s): APTT, INR in the last 168 hours.  Sepsis Markers No results for input(s): LATICACIDVEN, PROCALCITON, O2SATVEN in the last 168 hours.  ABG No results for input(s): PHART, PCO2ART, PO2ART in the last 168 hours.  Liver Enzymes No results for input(s): AST, ALT, ALKPHOS, BILITOT, ALBUMIN in the last 168 hours.  Cardiac Enzymes No results for input(s): TROPONINI, PROBNP in the last 168 hours.  Glucose  Recent Labs Lab 05/21/17 0053 05/21/17 0755 05/21/17 1137  GLUCAP 119* 105* 146*   Imaging No results found.  CTA Chest (05/20/17, from outside hospital): on my review of this CTA images (on disc), there is no PE. There is extensive subpleural reticulation, bronchiectasis, and honeycombing in bases (L>R), consistent with radiographically classic UIP pattern of IPF. There are not high-resolution images in this CT study, and it is hard to tell how much GGO's there are given the motion artifact. I do not appreciate any discrete infiltrates, however without having old Chest CT's for comparison, cannot rule out underlying infiltrate that is obscured by his fibrosis.    ASSESSMENT / PLAN: 71yoM with history of Pulmonary fibrosis (worked up and treated at the [facility] hospital), CVA (2009), Cerebral venous thrombosis (since 2009, on chronic coumadin), HTN, DM, and Diastolic CHF (on TTE 11/2015), admitted with hemoptysis and productive (white thick) sputum x 1 day.    1. Hemoptysis:  - small volume mixed with purulent  sputum over past day, may be due to infection in setting of being anticoagulated - agree with holding coumadin (does he even need it?); recheck INR now - if degree of hemoptysis worsens, please call ICU emergently at pager listed below  2. Pulmonary fibrosis; Possible superimposed acute bacterial bronchitis: - patient has been diagnosed with and presumably worked up for this pulmonary fibrosis at the [facility] hospital. I do not currently have those records. Although this CT is not a High-Res Chest CT, it does appear to show the radiographically classic UIP pattern consistent with a diagnosis of IPF. It would be nice to get other older Chest CT's from the [place] to use in comparison. Would also be helpful to know what workup has been done already. This could be IPF vs possibly asbestosis (has asbestos exposure). Although most rheumatologic etiologies of pulmonary fibrosis give more of an NSIP pattern, that is not a fixed rule. And he should have a rheum panel as an outpatient workup at  somepoint - as an outpatient he would benefit from pulmonary followup with complete PFT's, Six-minute walk, and referral to pulmonary rehab - obtain sputum culture - continue Ceftriaxone and Azithro as you are doing - add scheduled guaifenesin as he complains the sputum is too thick to expectorate - add IS and Flutter valve  3. Pulmonary nodules: - There are some ground-glass tiny nodules (6mm and less) commented on in the radiology report, which can be followed up as an outpatient.   4. Cerebral venous thrombosis; Chronic HA's - patient reports he has been on coumadin since 2009 for a cerebral venous thrombosis; now given the hemoptysis, I question if the risk of anticoag outweighs the benefits.  - will stop coumadin for now - primary team needs to dig into this history more to see if this continued anticoag is absolutely necessary - consider new Head imaging given worsening HA's.   5. Hypokalemia: - repleted with 40MEQ  KCL PO  6. Hx Diastolic dysfunction: - seen on TTE from 11/2015; BNP elevated at 441 on 10/20, and patient with trace pedal edema on exam - give Lasix 20mg  IV once and re-eval response.    60 minutes spent on this consult  Milana ObeyKathleen Hammonds, MD Pulmonary and Critical Care Medicine [facility] Pager: [phone]  05/21/2017, 12:25 PM

## 2017-05-21 NOTE — Progress Notes (Signed)
PROGRESS NOTE  Joel Santos ZOX:[id number] DOB: 02-23-1946 DOA: 05/20/2017 PCP: Juliette Alcide, MD  HPI/Recap of past 24 hours: Joel Santos is a 71 year old male with medical history significant for chronic pulmonary fibrosis (followed in [place]), remote history of CVA and cerebral sinus thrombosis on Coumadin, hypertension diabetes who presents to the ED as transfer from [facility] rocking him for acute onset of hemoptysis times 1 day.  Patient states the past 2-4 weeks he is noticed worsening shortness of breath with exertion.  He also notes dyspnea even with rest.  Denies any associated chest pain, nausea, vomiting or lightheadedness.  Patient uses 2 pillows at night orthopnea.  Denies any known peripheral edema and denies paroxysmal nocturnal dyspnea.  Patient states the day of his presentation to the ED he had several episodes of coughing up blood.  He reports the amount as 6-7 "mouthfuls of blood".  He denies any recent sick contacts, fevers or chills.  Patient reports being on Coumadin since 2011 for strokes and cerebral sinus thrombosis.  He states his last supratherapeutic INR was 2-3 months ago.  Since then he has remained on the same Coumadin dose.  He does note he notices small amount of blood in his urine since being on Coumadin.  Patient states he has his own pulmonologist in [place] who follows him for his pulmonary fibrosis.  He reports being started on 2 inhalers of which she does not know the name 67 months ago.  Patient at baseline is on 4 L of oxygen 24 hours a day.  Workup at outside ED:  INR 2.6, CXR just shows pulm fibrosis, CTA shows no PE (d.dimer also neg) but does show new ground-glass nodular opacities throughout both lungs with worsening mediastinal adenopathy.  They note that these findings would be infectious, inflammatory, or neoplastic. Patient was started on ceftriaxone and azithromycin.  Family requested transfer to facility with pulmonology specialty  prompting admission to [facility].  Assessment/Plan: Principal Problem:   Hemoptysis Active Problems:   Type 2 diabetes mellitus (HCC)   History of Coumadin therapy   Pulmonary fibrosis, unspecified (HCC)   Respiratory failure, acute-on-chronic (HCC)  Acute on chronic hypoxic respiratory failure Likely combined community-acquired pneumonia and chronic pulmonary fibrosis CTA negative for PE but concerning for worsening mediastinal adenopathy in setting of known pulmonary fibrosis.  In addition to the scattered groundglass opacities. Pulmonology consulted: CT findings radiographically classic for UIP pattern, appreciate their recs Baseline oxygen 4L currently on 5L [place] - Continue ceftriaxone and azithromycin, descalate to oral if clinically remains stable -Added guaifenesin he, incentive spirometer, flutter valve for supportive care -Sputum culture -Will need outpatient pulmonary follow-up: Complete PFTs, 6-minute walk, pulmonary rehab  Cerebral venous thrombosis Coumadin therapy -We will need to obtain records to determine necessity of continued Coumadin therapy given the risk of hemoptysis. -If patient continues to have headache that is not improved with supportive care will obtain CT head imaging - Holding Coumadin  Groundglass pulmonary nodules -Recommend outpatient follow-up  CHF with preserved ejection fraction Query trace pedal edema physical exam.  Otherwise no JVD and imaging of chest shows no pulmonary edema Status post IV Lasix 20 mg x1 Monitoring intake/output and volume status   Code Status: Full Code   Family Communication: No family at bedside   Disposition Plan: ensure no recurrent hemoptysis with antibiotics and supportive care, obtain pulm records, de-escalation to oral antibiotics before discharge    Consultants:  Pulmonology  Procedures:  None  Antimicrobials:  Ceftriaxone  10/20-Current  Azithromycin 10/20-Current   DVT  prophylaxis: SCDs   Objective: Vitals:   05/21/17 0054 05/21/17 0557 05/21/17 0557 05/21/17 1343  BP: 109/62 103/62 103/62 (!) 119/58  Pulse: 84 73 73 82  Resp: 20 20 18 19   Temp: 98 F (36.7 C) 97.8 F (36.6 C) 97.8 F (36.6 C) 98.5 F (36.9 C)  TempSrc: Oral Oral Oral Oral  SpO2: 100% 99% 99% 99%  Weight: 82.1 kg (180 lb 14.4 oz)     Height: 6\' 1"  (1.854 m)       Intake/Output Summary (Last 24 hours) at 05/21/17 1740 Last data filed at 05/21/17 1141  Gross per 24 hour  Intake              240 ml  Output                4 ml  Net              236 ml   Filed Weights   05/21/17 0054  Weight: 82.1 kg (180 lb 14.4 oz)    Exam:   General: Lying in bed comfortably, in no obvious distress, pleasant in conversation  Cardiovascular: Regular rate and rhythm, no murmurs rubs or gallops, no JVD, scant pedal edema  Respiratory: Diffuse crackles in all lung fields predominantly at bases and mid lung, no obvious wheezing or rhonchi, on 5 L nasal cannula oxygen, no accessory muscle use  Abdomen: Soft, nondistended, slightly tender in lower quadrants, no rebound or guarding  Musculoskeletal: Normal range of motion  Skin: Dry and intact   Psychiatry: normal affect and mood   Data Reviewed: CBC:  Recent Labs Lab 05/21/17 0229  WBC 11.0*  HGB 12.5*  HCT 37.6*  MCV 88.7  PLT 207   Basic Metabolic Panel:  Recent Labs Lab 05/21/17 0229  NA 134*  K 3.2*  CL 99*  CO2 27  GLUCOSE 144*  BUN 11  CREATININE 0.88  CALCIUM 8.5*   GFR: Estimated Creatinine Clearance: 84.5 mL/min (by C-G formula based on SCr of 0.88 mg/dL). Liver Function Tests: No results for input(s): AST, ALT, ALKPHOS, BILITOT, PROT, ALBUMIN in the last 168 hours. No results for input(s): LIPASE, AMYLASE in the last 168 hours. No results for input(s): AMMONIA in the last 168 hours. Coagulation Profile:  Recent Labs Lab 05/21/17 1308  INR 2.62   Cardiac Enzymes: No results for input(s):  CKTOTAL, CKMB, CKMBINDEX, TROPONINI in the last 168 hours. BNP (last 3 results) No results for input(s): PROBNP in the last 8760 hours. HbA1C: No results for input(s): HGBA1C in the last 72 hours. CBG:  Recent Labs Lab 05/21/17 0053 05/21/17 0755 05/21/17 1137 05/21/17 1648  GLUCAP 119* 105* 146* 113*   Lipid Profile: No results for input(s): CHOL, HDL, LDLCALC, TRIG, CHOLHDL, LDLDIRECT in the last 72 hours. Thyroid Function Tests: No results for input(s): TSH, T4TOTAL, FREET4, T3FREE, THYROIDAB in the last 72 hours. Anemia Panel: No results for input(s): VITAMINB12, FOLATE, FERRITIN, TIBC, IRON, RETICCTPCT in the last 72 hours. Urine analysis: No results found for: COLORURINE, APPEARANCEUR, LABSPEC, PHURINE, GLUCOSEU, HGBUR, BILIRUBINUR, KETONESUR, PROTEINUR, UROBILINOGEN, NITRITE, LEUKOCYTESUR Sepsis Labs: @LABRCNTIP (procalcitonin:4,lacticidven:4)  ) Recent Results (from the past 240 hour(s))  Culture, sputum-assessment     Status: None   Collection Time: 05/21/17 12:00 PM  Result Value Ref Range Status   Specimen Description SPUTUM  Final   Special Requests NONE  Final   Sputum evaluation THIS SPECIMEN IS ACCEPTABLE FOR SPUTUM CULTURE  Final  Report Status 05/21/2017 FINAL  Final  Culture, respiratory (NON-Expectorated)     Status: None (Preliminary result)   Collection Time: 05/21/17 12:00 PM  Result Value Ref Range Status   Specimen Description SPUTUM  Final   Special Requests NONE Reflexed from [id number]  Final   Gram Stain   Final    MODERATE WBC PRESENT, PREDOMINANTLY PMN FEW SQUAMOUS EPITHELIAL CELLS PRESENT RARE GRAM POSITIVE COCCI    Culture PENDING  Incomplete   Report Status PENDING  Incomplete      Studies: No results found.  Scheduled Meds: . atorvastatin  20 mg Oral Daily  . dextromethorphan-guaiFENesin  1 tablet Oral BID  . diazepam  5 mg Oral BID  . gabapentin  800 mg Oral QID  . insulin aspart  0-9 Units Subcutaneous TID WC  . lubiprostone   24 mcg Oral BID WC  . venlafaxine XR  300 mg Oral Daily    Continuous Infusions: . azithromycin    . cefTRIAXone (ROCEPHIN)  IV       LOS: 1 day     Laverna Peace, MD [facility] Pager [phone]  If 7PM-7AM, please contact night-coverage [email or web address] Password [email or web address] 05/21/2017, 5:40 PM

## 2017-05-21 NOTE — H&P (Signed)
History and Physical    Joel Santos Stong ZOX:[id number] DOB: 12-15-1945 DOA: 05/20/2017  PCP: Juliette AlcideBurdine, Steven E, MD  Patient coming from: Home  I have personally briefly reviewed patient's old medical records in [facility]  Chief Complaint: Hemoptysis  HPI: Joel Santos Andujo is a 71 y.o. male with medical history significant of pulmonary fibrosis, CVA, DVT, HTN, DM.  Patient [facility] ED [facility] for onset of coughing up bright red blood today.  Is on chronic coumadin for a history of "blood clots" some years ago (maybe the venous sinus thrombosis on his PMH?).  Denies fever, chills, CP, abd pain, leg swelling.   ED Course: INR 2.6, CXR just shows pulm fibrosis, CTA shows no PE (d.dimer also neg) but does show new ground-glass nodular opacities throughout both lungs with worsening mediastinal adenopathy.  They note that these findings would be infectious, inflammatory, or neoplastic.  Satting well on just 2L via [place].  Patient started on rocephin / azithromycin.  Requested transfer for pulm evaluation.   Review of Systems: As per HPI otherwise 10 point review of systems negative.   Past Medical History:  Diagnosis Date  . Abnormal myocardial perfusion study    Possible lateral ischemia March 2013  . Anxiety   . Cerebral venous thrombosis    MR venogram with patent dural sinuses 2009  . Chronic pain   . History of hematuria    Cystoscopy - follows with Urology  . Mixed hyperlipidemia   . Stroke [facility](HCC)    Recurrent by report - although head MRI negative 2009  . Type 2 diabetes mellitus (HCC)     Past Surgical History:  Procedure Laterality Date  . Arm surgery  2006  . COLONOSCOPY       reports that he quit smoking about 21 years ago. His smoking use included Cigarettes. He has a 90.00 pack-year smoking history. His smokeless tobacco use includes Chew. He reports that he does not drink alcohol or use drugs.  No Known Allergies  Family History  Problem  Relation Age of Onset  . Cancer Mother   . Diabetes Mother   . Coronary artery disease Mother        MI age 73     Prior to Admission medications   Medication Sig Start Date End Date Taking? Authorizing Provider  atorvastatin (LIPITOR) 20 MG tablet Take 20 mg by mouth daily.    [provider]  diazepam (VALIUM) 5 MG tablet Take 5 mg by mouth 2 (two) times daily.    [provider]  gabapentin (NEURONTIN) 800 MG tablet Take 800 mg by mouth 4 (four) times daily.    [provider]  glipiZIDE (GLUCOTROL) 5 MG tablet Take 2.5 mg by mouth 2 (two) times daily before a meal.    [provider]  lubiprostone (AMITIZA) 24 MCG capsule Take 1 capsule (24 mcg total) by mouth 2 (two) times daily with a meal. 05/07/15   Anice PaganiniGill, Eric A, NP  nitroGLYCERIN (NITROSTAT) 0.4 MG SL tablet Place 1 tablet (0.4 mg total) under the tongue every 5 (five) minutes as needed for chest pain. 12/09/11 03/07/17  Jonelle SidleMcDowell, Samuel G, MD  oxyCODONE-acetaminophen (PERCOCET) 5-325 MG per tablet Take 1 tablet by mouth every 6 (six) hours as needed.    [provider]  venlafaxine XR (EFFEXOR-XR) 150 MG 24 hr capsule Take 300 mg by mouth daily.    [provider]  warfarin (COUMADIN) 5 MG tablet Take 5 mg by  mouth as directed.    [provider]    Physical Exam: There were no vitals filed for this visit.  Constitutional: NAD, calm, comfortable Eyes: PERRL, lids and conjunctivae normal ENMT: Mucous membranes are moist. Posterior pharynx clear of any exudate or lesions.Normal dentition.  Neck: normal, supple, no masses, no thyromegaly Respiratory: Crackles bilaterally Cardiovascular: Regular rate and rhythm, no murmurs / rubs / gallops. No extremity edema. 2+ pedal pulses. No carotid bruits.  Abdomen: no tenderness, no masses palpated. No hepatosplenomegaly. Bowel sounds positive.  Musculoskeletal: no clubbing / cyanosis. No joint deformity upper and lower  extremities. Good ROM, no contractures. Normal muscle tone.  Skin: no rashes, lesions, ulcers. No induration Neurologic: CN 2-12 grossly intact. Sensation intact, DTR normal. Strength 5/5 in all 4.  Psychiatric: Normal judgment and insight. Alert and oriented x 3. Normal mood.    Labs on Admission: I have personally reviewed following labs and imaging studies  CBC: No results for input(s): WBC, NEUTROABS, HGB, HCT, MCV, PLT in the last 168 hours. Basic Metabolic Panel: No results for input(s): NA, K, CL, CO2, GLUCOSE, BUN, CREATININE, CALCIUM, MG, PHOS in the last 168 hours. GFR: CrCl cannot be calculated (No order found.). Liver Function Tests: No results for input(s): AST, ALT, ALKPHOS, BILITOT, PROT, ALBUMIN in the last 168 hours. No results for input(s): LIPASE, AMYLASE in the last 168 hours. No results for input(s): AMMONIA in the last 168 hours. Coagulation Profile: No results for input(s): INR, PROTIME in the last 168 hours. Cardiac Enzymes: No results for input(s): CKTOTAL, CKMB, CKMBINDEX, TROPONINI in the last 168 hours. BNP (last 3 results) No results for input(s): PROBNP in the last 8760 hours. HbA1C: No results for input(s): HGBA1C in the last 72 hours. CBG: No results for input(s): GLUCAP in the last 168 hours. Lipid Profile: No results for input(s): CHOL, HDL, LDLCALC, TRIG, CHOLHDL, LDLDIRECT in the last 72 hours. Thyroid Function Tests: No results for input(s): TSH, T4TOTAL, FREET4, T3FREE, THYROIDAB in the last 72 hours. Anemia Panel: No results for input(s): VITAMINB12, FOLATE, FERRITIN, TIBC, IRON, RETICCTPCT in the last 72 hours. Urine analysis: No results found for: COLORURINE, APPEARANCEUR, LABSPEC, PHURINE, GLUCOSEU, HGBUR, BILIRUBINUR, KETONESUR, PROTEINUR, UROBILINOGEN, NITRITE, LEUKOCYTESUR  Radiological Exams on Admission: No results found.  EKG: Independently reviewed.  Assessment/Plan Principal Problem:   Hemoptysis Active Problems:   Type 2  diabetes mellitus (HCC)   History of Coumadin therapy   Pulmonary fibrosis, unspecified (HCC)    1. Hemoptysis - no further episodes, DDX includes PNA, autoimmune, or neoplasm 1. PNA pathway; will empirically treat as CAP to start with 2. Rocephin / azithro empirically 3. Will order cultures, but already got rocephin / azithro in ED 4. Call pulm in AM 2. H/o coumadin therapy - not for mechanical valve 1. Hold coumadin for now 3. DM2 - 1. Sensitive SSI AC  DVT prophylaxis: SCDs Code Status: Full Family Communication: No family in room Disposition Plan: Home after admit Consults called: Call pulm in AM Admission status: Admit to inpatient   Hillary Bow DO [facility] Pager [phone]  If 7AM-7PM, please contact day team taking care of patient [email or web address] Password [facility]  05/21/2017, 12:39 AM

## 2017-05-21 NOTE — Progress Notes (Signed)
Pt was transferred [facility], alert and oriented on arrival to the floor self introduced to the pt, ID bracelet verified with pt, fall assessment done  Vital sign stable, abrasion on face and blt lower extremity, prescribed treatment started  Will continue to monitor

## 2017-05-22 DIAGNOSIS — E440 Moderate protein-calorie malnutrition: Secondary | ICD-10-CM | POA: Insufficient documentation

## 2017-05-22 DIAGNOSIS — J8410 Pulmonary fibrosis, unspecified: Secondary | ICD-10-CM

## 2017-05-22 LAB — GLUCOSE, CAPILLARY
GLUCOSE-CAPILLARY: 124 mg/dL — AB (ref 65–99)
GLUCOSE-CAPILLARY: 97 mg/dL (ref 65–99)
Glucose-Capillary: 95 mg/dL (ref 65–99)
Glucose-Capillary: 128 mg/dL — ABNORMAL HIGH (ref 65–99)

## 2017-05-22 LAB — CBC
HCT: 41.1 % (ref 39.0–52.0)
Hemoglobin: 13.5 g/dL (ref 13.0–17.0)
MCH: 29.7 pg (ref 26.0–34.0)
MCHC: 32.8 g/dL (ref 30.0–36.0)
MCV: 90.5 fL (ref 78.0–100.0)
PLATELETS: 221 10*3/uL (ref 150–400)
RBC: 4.54 MIL/uL (ref 4.22–5.81)
RDW: 13.6 % (ref 11.5–15.5)
WBC: 12.9 10*3/uL — ABNORMAL HIGH (ref 4.0–10.5)

## 2017-05-22 LAB — EXPECTORATED SPUTUM ASSESSMENT W REFEX TO RESP CULTURE

## 2017-05-22 LAB — EXPECTORATED SPUTUM ASSESSMENT W GRAM STAIN, RFLX TO RESP C

## 2017-05-22 NOTE — Progress Notes (Signed)
Initial Nutrition Assessment  DOCUMENTATION CODES:   Non-severe (moderate) malnutrition in context of chronic illness  INTERVENTION:   -Continue Ensure Enlive po BID, each supplement provides 350 kcal and 20 grams of protein  NUTRITION DIAGNOSIS:   Malnutrition (Moderate) related to chronic illness (pulmonary fibrosis) as evidenced by energy intake < 75% for > or equal to 1 month, mild depletion of body fat, moderate depletion of body fat, mild depletion of muscle mass, moderate depletions of muscle mass.  GOAL:   Patient will meet greater than or equal to 90% of their needs  MONITOR:   PO intake, Supplement acceptance, Labs, Weight trends, Skin, I & O's  REASON FOR ASSESSMENT:   Malnutrition Screening Tool    ASSESSMENT:   71yoM with history of Pulmonary fibrosis ([facility]), CVA (2009), Cerebral venous thrombosis (since 2009, on chronic coumadin), HTN, DM, and Diastolic CHF (on TTE 11/2015), admitted with hemoptysis and productive (white thick) sputum x 1 day.    Pt admitted with hemoptysis and pulmonary fibrosis.   Spoke with pt, who reports poor appetite and weight loss over the past 6 months. He shares that he used to around 250# at baseline, but has steadily lost weight (however, this is not consistent with documented wt hx). He reports he used to eat 3 meals per day, but now often only eats 1-2 meals per day (meals consist of meat, starch, and vegetable), as he is often too fatigued to eat at night. He consumed all of his breakfast per his reports, meal completion 85-100% per doc flowsheets.   He shares that his friends and family have noticed changes in his appearance and he himself as also noted muscle loss.   Nutrition-Focused physical exam completed. Findings are mild to moderate fat depletion, mild to moderate muscle depletion, and no edema.   Discussed importance of good nutritional intake via food and supplement to promote healing. Pt  reports he has taken Ensure in the past, but not consistently. He is amenable to consume both during hospitalization and at home.   Labs reviewed: CBGS: 113-135 (inpatient orders for glycemic control are 0-9 units insulin aspart TID with meals).   Diet Order:  Diet Heart Room service appropriate? Yes; Fluid consistency: Thin  Skin:  Reviewed, no issues  Last BM:  05/21/17  Height:   Ht Readings from Last 1 Encounters:  05/21/17 6\' 1"  (1.854 m)    Weight:   Wt Readings from Last 1 Encounters:  05/22/17 180 lb 12.4 oz (82 kg)    Ideal Body Weight:  83.6 kg  BMI:  Body mass index is 23.85 kg/m.  Estimated Nutritional Needs:   Kcal:  2200-2400  Protein:  110-125 grams  Fluid:  2.2-2.4 L  EDUCATION NEEDS:   Education needs addressed  Tawanna Funk A. Mayford KnifeWilliams, RD, LDN, CDE Pager: [phone] After hours Pager: [phone]

## 2017-05-22 NOTE — Progress Notes (Signed)
PULMONARY / CRITICAL CARE MEDICINE   Name: Joel SitesJackie Eve MRN: [id number] DOB: 05/18/1946    ADMISSION DATE:  05/20/2017 CONSULTATION DATE: 05/20/17  REFERRING MD:  Dr Caleb PoppNettey  CHIEF COMPLAINT: hemoptysis  HISTORY OF PRESENT ILLNESS:   71yoM with history of Pulmonary fibrosis ([facility]), CVA (2009), Cerebral venous thrombosis (since 2009, on chronic coumadin), HTN, DM, and Diastolic CHF (on TTE 11/2015), presented to an outside hospital on 10/20 c/o hemoptysis. [facility] was [facility] where Pulmonary consult is now requested. On my exam, patient c/o coughing up dark brownish blood mixed with thick white sputum x 1 day. He says the bloody sputum is approx 1-2tsp at a time and a total of approx 20cc within the past day. He also c/o worsening of his chronic HA's. He admits to SOB which he says is chronic. [facility] that could be [facility] for his pulmonary fibrosis. He says he has never had a lung biopsy. He admits to prior exposure to asbestos. He is not sure what his work-up for his pulmonary fibrosis has included thus far; he is a poor historian.   ROS VITAL SIGNS: BP 138/78 (BP Location: Right Arm)   Pulse 88   Temp 98.5 F (36.9 C) (Oral)   Resp 20   Ht 6\' 1"  (1.854 m)   Wt 82 kg (180 lb 12.4 oz)   SpO2 97%   BMI 23.85 kg/m   INTAKE / OUTPUT: I/O last 3 completed shifts: In: 490 [P.O.:240; IV Piggyback:250] Out: 4 [Urine:2; Stool:2]  PHYSICAL EXAMINATION: General: frail elderly male in NAD Neuro: AAOx3, moving all extremities HEENT: [place]/AT, PERRL, no JVD Cardiovascular: RRR, no MRG Lungs: L clear, R base rhonchi.  Abdomen:  Soft, non-distended Musculoskeletal: No acute deformity or ROM limitation. Contracture of R hand Skin: Grossly intact  LABS:  BMET  Recent Labs Lab 05/21/17 0229  NA 134*  K 3.2*  CL 99*  CO2 27  BUN 11  CREATININE 0.88  GLUCOSE 144*   Electrolytes  Recent  Labs Lab 05/21/17 0229  CALCIUM 8.5*   CBC  Recent Labs Lab 05/21/17 0229 05/22/17 0738  WBC 11.0* 12.9*  HGB 12.5* 13.5  HCT 37.6* 41.1  PLT 207 221   Coag's  Recent Labs Lab 05/21/17 1308  INR 2.62    Sepsis Markers No results for input(s): LATICACIDVEN, PROCALCITON, O2SATVEN in the last 168 hours.  ABG No results for input(s): PHART, PCO2ART, PO2ART in the last 168 hours.  Liver Enzymes No results for input(s): AST, ALT, ALKPHOS, BILITOT, ALBUMIN in the last 168 hours.  Cardiac Enzymes No results for input(s): TROPONINI, PROBNP in the last 168 hours.  Glucose  Recent Labs Lab 05/21/17 0755 05/21/17 1137 05/21/17 1648 05/21/17 2158 05/22/17 0754 05/22/17 1201  GLUCAP 105* 146* 113* 135* 124* 97   Imaging No results found.  CTA Chest (05/20/17, from outside hospital): on my review of this CTA images (on disc), there is no PE. There is extensive subpleural reticulation, bronchiectasis, and honeycombing in bases (L>R), consistent with radiographically classic UIP pattern of IPF. There are not high-resolution images in this CT study, and it is hard to tell how much GGO's there are given the motion artifact. I do not appreciate any discrete infiltrates, however without having old Chest CT's for comparison, cannot rule out underlying infiltrate that is obscured by his fibrosis.    ASSESSMENT / PLAN: 71yoM with history  of Pulmonary fibrosis (worked up and treated at the [facility] hospital), CVA (2009), Cerebral venous thrombosis (since 2009, on chronic coumadin), HTN, DM, and Diastolic CHF (on TTE 11/2015), admitted with hemoptysis and productive (white thick) sputum x 1 day.    1. Hemoptysis: - small volume mixed with purulent sputum over past day, may be due to infection in setting of being anticoagulated. This has been improving.  - Continue to hold warfarin. Will need to determine if this can be restarted or if he even needs to keep taking it.  - if degree of  hemoptysis worsens, please call ICU emergently at pager listed below  2. Pulmonary fibrosis; Possible superimposed acute bacterial bronchitis: - patient has been diagnosed with and presumably worked up for this pulmonary fibrosis at the [facility] hospital. CT from [place] seems consistent with UIP. Would also be helpful to know what workup has been done already. This could be IPF vs possibly asbestosis (has asbestos exposure).  He should have a rheum panel as an outpatient workup at some point. - as an outpatient he would benefit from pulmonary followup with complete PFT's, Six-minute walk, and referral to pulmonary rehab - Cultures pending - continue Ceftriaxone and Azithro - add scheduled guaifenesin as he complains the sputum is too thick to expectorate - add IS and Flutter valve  3. Pulmonary nodules: - There are some ground-glass tiny nodules (6mm and less) commented on in the radiology report, which can be followed up as an outpatient.   4. Cerebral venous thrombosis; Chronic HA's - holding warfarin.   6. Hx Diastolic dysfunction: - seen on TTE from 11/2015; BNP elevated at 441 on 10/20, and patient with trace pedal edema on exam - unclear response to 20mg  lasix on 10/21. BMP 10/23 am  Joneen Roach, AGACNP-BC [facility] Pulmonology/Critical Care Pager [phone] or [phone]  05/22/2017 1:24 PM

## 2017-05-22 NOTE — Progress Notes (Signed)
PROGRESS NOTE  Lennice SitesJackie Burdin ZOX:[id number] DOB: [birth year] DOA: 05/20/2017 PCP: Juliette AlcideBurdine, Steven E, MD  HPI/Recap of past 24 hours: Mr. Thomasena EdisCollins is a 71 year old male with medical history significant for chronic pulmonary fibrosis (followed in [place]), remote history of CVA and cerebral sinus thrombosis on Coumadin, hypertension diabetes who presents [facility] the ED [facility] for acute onset of hemoptysis times 1 day.  Patient states the past 2-4 weeks he is noticed worsening shortness of breath with exertion.  He also notes dyspnea even with rest.  Denies any associated chest pain, nausea, vomiting or lightheadedness.  Patient uses 2 pillows at night orthopnea.  Denies any known peripheral edema and denies paroxysmal nocturnal dyspnea.  Patient states the day of his presentation to the ED he had several episodes of coughing up blood.  He reports the amount as 6-7 "mouthfuls of blood".  He denies any recent sick contacts, fevers or chills.  Patient reports being on Coumadin since 2011 for strokes and cerebral sinus thrombosis.  He states his last supratherapeutic INR was 2-3 months ago.  Since then he has remained on the same Coumadin dose.  He does note he notices small amount of blood in his urine since being on Coumadin.  Patient states he has his own pulmonologist in [place] who follows him for his pulmonary fibrosis.  He reports being started on 2 inhalers of which she does not know the name 67 months ago.  Patient at baseline is on 4 L of oxygen 24 hours a day.  Workup at outside ED:  INR 2.6, CXR just shows pulm fibrosis, CTA shows no PE (d.dimer also neg) but does show new ground-glass nodular opacities throughout both lungs with worsening mediastinal adenopathy.  They note that these findings would be infectious, inflammatory, or neoplastic. Patient was started on ceftriaxone and azithromycin.  Family requested transfer to facility with pulmonology specialty  [facility]ing [facility].  Subjective:  This morning patient reports he had a small amount of hemoptysis with cough.  Patient states flutter valve hass helped him quite a bit with coughing  Assessment/Plan: Principal Problem:   Hemoptysis Active Problems:   Type 2 diabetes mellitus (HCC)   History of Coumadin therapy   Pulmonary fibrosis, unspecified (HCC)   Respiratory failure, acute-on-chronic (HCC)   Malnutrition of moderate degree  Acute on chronic hypoxic respiratory failure,stable Likely combined community-acquired pneumonia and chronic pulmonary fibrosis,stable Hemoptysis, improving CTA negative for PE but concerning for worsening mediastinal adenopathy in setting of known pulmonary fibrosis.  In addition to the scattered groundglass opacities. Pulmonology consulted: CT findings radiographically classic for UIP pattern, appreciate their recs Baseline oxygen 4L currently on 5L [place] - Ceftriaxone and azithromycin, f/u repeat CXR here, descalate to oral if clinically remains stable, wean oxygen -Supportive care: Guaifenesin , incentive spirometer, flutter valve  -f/u Sputum culture -Will need outpatient pulmonary follow-up: Complete PFTs, 6-minute walk, pulmonary rehab -Hemoglobin remained stable, patient coughing up less blood in the last 24 hours, we will continue to monitor  Cerebral venous thrombosis Coumadin therapy -We will need to obtain records to determine necessity of continued Coumadin therapy given the risk of hemoptysis. -If patient continues to have headache that is not improved with supportive care will obtain CT head imaging - Holding Coumadin  Groundglass pulmonary nodules -Recommend outpatient follow-up  CHF with preserved ejection fraction  Status post IV Lasix 20 mg x1 this admission Monitoring intake/output and volume status   Code Status: Full  Code   Family Communication: No family at bedside   Disposition Plan: ensure no recurrent  hemoptysis with antibiotics and supportive care, obtain pulm records, de-escalation to oral antibiotics before discharge    Consultants:  Pulmonology  Procedures:  None  Antimicrobials:  Ceftriaxone 10/20-Current  Azithromycin 10/20-Current   DVT prophylaxis: SCDs   Objective: Vitals:   05/21/17 1343 05/21/17 2159 05/22/17 0615 05/22/17 1416  BP: (!) 119/58 (!) 102/56 138/78 (!) 98/59  Pulse: 82 92 88 92  Resp: 19 20 20 18   Temp: 98.5 F (36.9 C) 98.7 F (37.1 C) 98.5 F (36.9 C) 98.4 F (36.9 C)  TempSrc: Oral Oral Oral Oral  SpO2: 99% 95% 97% 100%  Weight:   82 kg (180 lb 12.4 oz)   Height:        Intake/Output Summary (Last 24 hours) at 05/22/17 1536 Last data filed at 05/22/17 1300  Gross per 24 hour  Intake              780 ml  Output                0 ml  Net              780 ml   Filed Weights   05/21/17 0054 05/22/17 0615  Weight: 82.1 kg (180 lb 14.4 oz) 82 kg (180 lb 12.4 oz)    Exam:   General: Lying in bed comfortably, in no obvious distress, pleasant in conversation  Respiratory: Diffuse crackles predominantly at bases, no obvious wheezing or rhonchi, on 5 L nasal cannula oxygen, no accessory muscle use  Musculoskeletal: Normal range of motion  Skin: Dry and intact   Psychiatry: normal affect and mood   Data Reviewed: CBC:  Recent Labs Lab 05/21/17 0229 05/22/17 0738  WBC 11.0* 12.9*  HGB 12.5* 13.5  HCT 37.6* 41.1  MCV 88.7 90.5  PLT 207 221   Basic Metabolic Panel:  Recent Labs Lab 05/21/17 0229  NA 134*  K 3.2*  CL 99*  CO2 27  GLUCOSE 144*  BUN 11  CREATININE 0.88  CALCIUM 8.5*   GFR: Estimated Creatinine Clearance: 84.5 mL/min (by C-G formula based on SCr of 0.88 mg/dL). Liver Function Tests: No results for input(s): AST, ALT, ALKPHOS, BILITOT, PROT, ALBUMIN in the last 168 hours. No results for input(s): LIPASE, AMYLASE in the last 168 hours. No results for input(s): AMMONIA in the last 168  hours. Coagulation Profile:  Recent Labs Lab 05/21/17 1308  INR 2.62   Cardiac Enzymes: No results for input(s): CKTOTAL, CKMB, CKMBINDEX, TROPONINI in the last 168 hours. BNP (last 3 results) No results for input(s): PROBNP in the last 8760 hours. HbA1C: No results for input(s): HGBA1C in the last 72 hours. CBG:  Recent Labs Lab 05/21/17 1137 05/21/17 1648 05/21/17 2158 05/22/17 0754 05/22/17 1201  GLUCAP 146* 113* 135* 124* 97   Lipid Profile: No results for input(s): CHOL, HDL, LDLCALC, TRIG, CHOLHDL, LDLDIRECT in the last 72 hours. Thyroid Function Tests: No results for input(s): TSH, T4TOTAL, FREET4, T3FREE, THYROIDAB in the last 72 hours. Anemia Panel: No results for input(s): VITAMINB12, FOLATE, FERRITIN, TIBC, IRON, RETICCTPCT in the last 72 hours. Urine analysis: No results found for: COLORURINE, APPEARANCEUR, LABSPEC, PHURINE, GLUCOSEU, HGBUR, BILIRUBINUR, KETONESUR, PROTEINUR, UROBILINOGEN, NITRITE, LEUKOCYTESUR Sepsis Labs: @LABRCNTIP (procalcitonin:4,lacticidven:4)  ) Recent Results (from the past 240 hour(s))  Culture, blood (routine x 2) Call MD if unable to obtain prior to antibiotics being given     Status: None (  Preliminary result)   Collection Time: 05/21/17  2:29 AM  Result Value Ref Range Status   Specimen Description BLOOD LEFT ARM  Final   Special Requests IN PEDIATRIC BOTTLE Blood Culture adequate volume  Final   Culture NO GROWTH 1 DAY  Final   Report Status PENDING  Incomplete  Culture, blood (routine x 2) Call MD if unable to obtain prior to antibiotics being given     Status: None (Preliminary result)   Collection Time: 05/21/17  2:33 AM  Result Value Ref Range Status   Specimen Description BLOOD RIGHT ARM  Final   Special Requests IN PEDIATRIC BOTTLE Blood Culture adequate volume  Final   Culture NO GROWTH 1 DAY  Final   Report Status PENDING  Incomplete  Culture, sputum-assessment     Status: None   Collection Time: 05/21/17 12:00 PM   Result Value Ref Range Status   Specimen Description SPUTUM  Final   Special Requests NONE  Final   Sputum evaluation THIS SPECIMEN IS ACCEPTABLE FOR SPUTUM CULTURE  Final   Report Status 05/21/2017 FINAL  Final  Culture, respiratory (NON-Expectorated)     Status: None (Preliminary result)   Collection Time: 05/21/17 12:00 PM  Result Value Ref Range Status   Specimen Description SPUTUM  Final   Special Requests NONE Reflexed from [id number]  Final   Gram Stain   Final    MODERATE WBC PRESENT, PREDOMINANTLY PMN FEW SQUAMOUS EPITHELIAL CELLS PRESENT RARE GRAM POSITIVE COCCI    Culture CULTURE REINCUBATED FOR BETTER GROWTH  Final   Report Status PENDING  Incomplete      Studies: No results found.  Scheduled Meds: . atorvastatin  20 mg Oral Daily  . dextromethorphan-guaiFENesin  1 tablet Oral BID  . diazepam  5 mg Oral BID  . feeding supplement (ENSURE ENLIVE)  237 mL Oral BID BM  . gabapentin  800 mg Oral QID  . insulin aspart  0-9 Units Subcutaneous TID WC  . lubiprostone  24 mcg Oral BID WC  . venlafaxine XR  300 mg Oral Daily    Continuous Infusions: . azithromycin Stopped (05/21/17 2150)  . cefTRIAXone (ROCEPHIN)  IV Stopped (05/21/17 1839)     LOS: 2 days     Laverna Peace, MD [facility] Pager [phone]  If 7PM-7AM, please contact night-coverage [email or web address] Password [facility] 05/22/2017, 3:36 PM

## 2017-05-23 ENCOUNTER — Inpatient Hospital Stay (HOSPITAL_COMMUNITY): Payer: Medicare PPO

## 2017-05-23 ENCOUNTER — Encounter (HOSPITAL_COMMUNITY): Payer: Self-pay | Admitting: Pulmonary Disease

## 2017-05-23 DIAGNOSIS — E440 Moderate protein-calorie malnutrition: Secondary | ICD-10-CM

## 2017-05-23 LAB — RESPIRATORY PANEL BY PCR
Adenovirus: NOT DETECTED
BORDETELLA PERTUSSIS-RVPCR: NOT DETECTED
CORONAVIRUS OC43-RVPPCR: NOT DETECTED
Chlamydophila pneumoniae: NOT DETECTED
Coronavirus 229E: NOT DETECTED
Coronavirus HKU1: NOT DETECTED
Coronavirus NL63: NOT DETECTED
INFLUENZA A-RVPPCR: NOT DETECTED
INFLUENZA B-RVPPCR: NOT DETECTED
METAPNEUMOVIRUS-RVPPCR: NOT DETECTED
MYCOPLASMA PNEUMONIAE-RVPPCR: NOT DETECTED
PARAINFLUENZA VIRUS 1-RVPPCR: NOT DETECTED
PARAINFLUENZA VIRUS 2-RVPPCR: NOT DETECTED
PARAINFLUENZA VIRUS 4-RVPPCR: NOT DETECTED
Parainfluenza Virus 3: NOT DETECTED
RESPIRATORY SYNCYTIAL VIRUS-RVPPCR: NOT DETECTED
Rhinovirus / Enterovirus: NOT DETECTED

## 2017-05-23 LAB — GLUCOSE, CAPILLARY
GLUCOSE-CAPILLARY: 108 mg/dL — AB (ref 65–99)
Glucose-Capillary: 93 mg/dL (ref 65–99)
Glucose-Capillary: 143 mg/dL — ABNORMAL HIGH (ref 65–99)
Glucose-Capillary: 127 mg/dL — ABNORMAL HIGH (ref 65–99)

## 2017-05-23 LAB — STREP PNEUMONIAE URINARY ANTIGEN: Strep Pneumo Urinary Antigen: NEGATIVE

## 2017-05-23 LAB — PROCALCITONIN: Procalcitonin: <0.1 ng/mL

## 2017-05-23 MED ORDER — DEXTROSE 5 % IV SOLN
1.0000 g | INTRAVENOUS | Status: DC
  Administered 2017-05-23: 1 g via INTRAVENOUS
  Filled 2017-05-23 (×2): qty 10

## 2017-05-23 MED ORDER — DEXTROSE 5 % IV SOLN
500.0000 mg | INTRAVENOUS | Status: DC
  Administered 2017-05-23: 500 mg via INTRAVENOUS
  Filled 2017-05-23 (×2): qty 500

## 2017-05-23 MED ORDER — AMOXICILLIN-POT CLAVULANATE 875-125 MG PO TABS
1.0000 | ORAL_TABLET | Freq: Two times a day (BID) | ORAL | Status: DC
  Administered 2017-05-23: 1 via ORAL
  Filled 2017-05-23: qty 1

## 2017-05-23 NOTE — Consult Note (Signed)
           [facility] CM Primary Care Navigator  05/23/2017  Joel SitesJackie Santos 1945-09-03 [id number]   Went to see patient in the room to identify possible discharge needs.  Patient reports "spitting out blood" and "difficulty breathing" that had led to this admission.  Patient endorses Joel Santos with [facility] as his primary care provider.   [facility] [place] [facility]. He mentioned that [facility] all [facility]. Patient reports managing his ownmedications at home straight out of the containers.   Patient verbalized thathe has beendriving prior to admission but his brother Joel Santos(Joel Santos- lives closeby) can providetransportation to his doctors' appointments after discharge ifneeded.  Per patient, wife Joel Santos(Joel Santos) is able to provide some assistance with his care needs at home, but his brother will be able to help him as needed.   Anticipated discharge plan is home per patient.  Patientexpressed understanding to callprimary care provider's officewhen he returnshome for a post hospital follow-up appointment within a week or sooner if needed. Patient letter (with PCP's contact number) was provided as areminder.  Explained to patient about [facility] CM services available for health management at home but he communicated having no current needs or concerns for now. He is unable to recall his recent A1C but states managing it that he does not need any DM medications so far. Patient voiced understanding to seek referral from primary care provider to [facility] care management if necessary for services in the future.  [facility] care management information provided for future needs that may arise.  Patient had verbally agreed and opted for EMMI calls to follow-uphis recovery at home.  Referral made for EMMI General calls after discharge   Primary care provider's office called (spoke  with Joel Fellingarla) and notified of patient's discharge disposition and need for post discharge follow-up and transition of care. Notified of health issues possibly needing follow-up (DM/ COPD/ HF). Joel Santos reported that patient's recent A1C is 5.8 (not seen in EPIC) and not on any diabetic medications. She also mentioned that patient is not on any medications for COPD. She indicated that these chronic conditions have been stable and not been an issue for patient.   Made aware to refer [facility] to [facility] if deemed necessary and appropriate for services in the future.   For questions, please contact:  Joel Santos, BSN, RN- [facility] Primary [facility] Navigator  Telephone: [phone] [facility]

## 2017-05-23 NOTE — Progress Notes (Addendum)
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 rocking him for acute onset of hemoptysis x one day and was found to have new groundglass opacities concerning for pneumonia.  CTA on admission was negative for PE but concerning for worsening mediastinal adenopathy in the setting of known pulmonary fibrosis in addition to scattered groundglass opacities.  Since admission, patient's Coumadin has been on hold with decreased frequency of episodes of hemoptysis.  Patient was empirically treated with azithromycin and ceftriaxone for CAP coverage will transition to Augmentin on 05/23/17.   Subjective:  This morning patient reports improvement in his cough.  Reports less frequent episodes of coughing up blood.  Continues to deny fevers, chills, nausea, vomiting, abdominal pain.  Assessment/Plan: Principal Problem:   Hemoptysis Active Problems:   Type 2 diabetes mellitus (HCC)   History of Coumadin therapy   Pulmonary fibrosis, unspecified (HCC)   Respiratory failure, acute-on-chronic (HCC)   Malnutrition of moderate degree  Acute on chronic hypoxic respiratory failure,stable Likely combined community-acquired pneumonia and chronic pulmonary fibrosis,stable Hemoptysis, improving CTA negative for PE but concerning for worsening mediastinal adenopathy in setting of known pulmonary fibrosis.  In addition to the scattered groundglass opacities. Pulmonology consulted: CT findings radiographically classic for UIP pattern, [id number] with chronic interstitial lung disease appreciate their recs Baseline oxygen 4L currently on 5L [place]  Hemoglobin remained stable,  patient coughing up less blood in the last 24 hours -Continue IV ceftriaxone, azithromycin while awaiting pending infectious workup -f/u Sputum culture, strep pneumo (urinary), Legionella -Wean oxygen -Supportive care: Guaifenesin , incentive spirometer, flutter valve  -Will need outpatient pulmonary follow-up: Complete PFTs, high-res CT, 6-minute walk, pulmonary rehab  Cerebral venous thrombosis Coumadin therapy -Obtain records PCP: Joel Santos, [facility] to determine necessity of continued Coumadin therapy given the risk of hemoptysis. -Holding Coumadin, if anticoagulation needs to be resumed will bridge with heparin drip for patient safety  Groundglass pulmonary nodules -Recommend outpatient follow-up  CHF with preserved ejection fraction, euvolemic  Status post IV Lasix 20 mg x1 this admission Monitoring intake/output and volume status  Non-severe (moderate malnutrition), in context of chronic illness -Her nutrition recommendations: Continue Ensure supplementation  Code Status: Full Code   Family Communication: No family at bedside, called daughter but no answer on 10/23.  Disposition Plan: ensure no recurrent hemoptysis with antibiotics, pending infectious workup, maintain no hemoptysis if anticoagulation is reinitiated.  Consultants:  Pulmonology  Procedures:  None  Antimicrobials:  Ceftriaxone 10/20-Current  Azithromycin 10/20-Current   DVT prophylaxis: SCDs   Objective: Vitals:   05/22/17 2053 05/22/17 2055 05/23/17 0401 05/23/17 0550  BP: 117/61 117/61  (!) 105/54  Pulse: 96 96  70  Resp: 18 18  18   Temp: 98.5 F (36.9 C) 98.5 F (36.9 C)  97.6 F (36.4 C)  TempSrc: Oral Oral  Oral  SpO2: 99% 99%  100%  Weight:   82.5 kg (181 lb 12.8 oz)   Height:        Intake/Output Summary (Last 24 hours) at 05/23/17 1324 Last data filed at 05/23/17 0951  Gross per 24 hour  Intake              260 ml  Output              200  ml  Net               60 ml   Filed Weights   05/21/17 0054 05/22/17 0615 05/23/17 0401  Weight: 82.1 kg (180 lb 14.4 oz) 82 kg (180 lb 12.4 oz) 82.5 kg (181 lb 12.8 oz)    Exam:   General: Lying in bed comfortably, in no obvious distress, pleasant in conversation  Respiratory: Diffuse crackles predominantly at bases, no obvious wheezing or rhonchi, on 5 L nasal cannula oxygen, no accessory muscle use  Musculoskeletal: Normal range of motion  Skin: Dry and intact   Psychiatry: normal affect and mood   Data Reviewed: CBC:  Recent Labs Lab 05/21/17 0229 05/22/17 0738  WBC 11.0* 12.9*  HGB 12.5* 13.5  HCT 37.6* 41.1  MCV 88.7 90.5  PLT 207 221   Basic Metabolic Panel:  Recent Labs Lab 05/21/17 0229  NA 134*  K 3.2*  CL 99*  CO2 27  GLUCOSE 144*  BUN 11  CREATININE 0.88  CALCIUM 8.5*   GFR: Estimated Creatinine Clearance: 84.5 mL/min (by C-G formula based on SCr of 0.88 mg/dL). Liver Function Tests: No results for input(s): AST, ALT, ALKPHOS, BILITOT, PROT, ALBUMIN in the last 168 hours. No results for input(s): LIPASE, AMYLASE in the last 168 hours. No results for input(s): AMMONIA in the last 168 hours. Coagulation Profile:  Recent Labs Lab 05/21/17 1308  INR 2.62   Cardiac Enzymes: No results for input(s): CKTOTAL, CKMB, CKMBINDEX, TROPONINI in the last 168 hours. BNP (last 3 results) No results for input(s): PROBNP in the last 8760 hours. HbA1C: No results for input(s): HGBA1C in the last 72 hours. CBG:  Recent Labs Lab 05/22/17 1201 05/22/17 1655 05/22/17 2105 05/23/17 0805 05/23/17 1225  GLUCAP 97 95 128* 93 143*   Lipid Profile: No results for input(s): CHOL, HDL, LDLCALC, TRIG, CHOLHDL, LDLDIRECT in the last 72 hours. Thyroid Function Tests: No results for input(s): TSH, T4TOTAL, FREET4, T3FREE, THYROIDAB in the last 72 hours. Anemia Panel: No results for input(s): VITAMINB12, FOLATE, FERRITIN, TIBC, IRON, RETICCTPCT in the last  72 hours. Urine analysis: No results found for: COLORURINE, APPEARANCEUR, LABSPEC, PHURINE, GLUCOSEU, HGBUR, BILIRUBINUR, KETONESUR, PROTEINUR, UROBILINOGEN, NITRITE, LEUKOCYTESUR Sepsis Labs: @LABRCNTIP (procalcitonin:4,lacticidven:4)  ) Recent Results (from the past 240 hour(s))  Culture, blood (routine x 2) Call MD if unable to obtain prior to antibiotics being given     Status: None (Preliminary result)   Collection Time: 05/21/17  2:29 AM  Result Value Ref Range Status   Specimen Description BLOOD LEFT ARM  Final   Special Requests IN PEDIATRIC BOTTLE Blood Culture adequate volume  Final   Culture NO GROWTH 1 DAY  Final   Report Status PENDING  Incomplete  Culture, blood (routine x 2) Call MD if unable to obtain prior to antibiotics being given     Status: None (Preliminary result)   Collection Time: 05/21/17  2:33 AM  Result Value Ref Range Status   Specimen Description BLOOD RIGHT ARM  Final   Special Requests IN PEDIATRIC BOTTLE Blood Culture adequate volume  Final   Culture NO GROWTH 1 DAY  Final   Report Status PENDING  Incomplete  Culture, sputum-assessment     Status: None   Collection Time: 05/21/17 12:00 PM  Result Value Ref Range Status   Specimen Description SPUTUM  Final   Special Requests NONE  Final  Sputum evaluation THIS SPECIMEN IS ACCEPTABLE FOR SPUTUM CULTURE  Final   Report Status 05/21/2017 FINAL  Final  Culture, respiratory (NON-Expectorated)     Status: None (Preliminary result)   Collection Time: 05/21/17 12:00 PM  Result Value Ref Range Status   Specimen Description SPUTUM  Final   Special Requests NONE Reflexed from [id number]  Final   Gram Stain   Final    MODERATE WBC PRESENT, PREDOMINANTLY PMN FEW SQUAMOUS EPITHELIAL CELLS PRESENT RARE GRAM POSITIVE COCCI    Culture   Final    RARE STAPHYLOCOCCUS AUREUS SUSCEPTIBILITIES TO FOLLOW MODERATE YEAST IDENTIFICATION TO FOLLOW    Report Status PENDING  Incomplete  Culture, expectorated  sputum-assessment     Status: None   Collection Time: 05/21/17  1:00 PM  Result Value Ref Range Status   Specimen Description EXPECTORATED SPUTUM  Final   Special Requests NONE  Final   Sputum evaluation THIS SPECIMEN IS ACCEPTABLE FOR SPUTUM CULTURE  Final   Report Status 05/22/2017 FINAL  Final  Culture, respiratory (NON-Expectorated)     Status: None (Preliminary result)   Collection Time: 05/21/17  1:00 PM  Result Value Ref Range Status   Specimen Description EXPECTORATED SPUTUM  Final   Special Requests NONE Reflexed from [id number]  Final   Gram Stain   Final    MODERATE WBC PRESENT, PREDOMINANTLY PMN FEW SQUAMOUS EPITHELIAL CELLS PRESENT FEW GRAM POSITIVE COCCI IN CLUSTERS FEW GRAM NEGATIVE RODS    Culture PENDING  Incomplete   Report Status PENDING  Incomplete  Respiratory Panel by PCR     Status: None   Collection Time: 05/23/17 10:45 AM  Result Value Ref Range Status   Adenovirus NOT DETECTED NOT DETECTED Final   Coronavirus 229E NOT DETECTED NOT DETECTED Final   Coronavirus HKU1 NOT DETECTED NOT DETECTED Final   Coronavirus NL63 NOT DETECTED NOT DETECTED Final   Coronavirus OC43 NOT DETECTED NOT DETECTED Final   Metapneumovirus NOT DETECTED NOT DETECTED Final   Rhinovirus / Enterovirus NOT DETECTED NOT DETECTED Final   Influenza A NOT DETECTED NOT DETECTED Final   Influenza B NOT DETECTED NOT DETECTED Final   Parainfluenza Virus 1 NOT DETECTED NOT DETECTED Final   Parainfluenza Virus 2 NOT DETECTED NOT DETECTED Final   Parainfluenza Virus 3 NOT DETECTED NOT DETECTED Final   Parainfluenza Virus 4 NOT DETECTED NOT DETECTED Final   Respiratory Syncytial Virus NOT DETECTED NOT DETECTED Final   Bordetella pertussis NOT DETECTED NOT DETECTED Final   Chlamydophila pneumoniae NOT DETECTED NOT DETECTED Final   Mycoplasma pneumoniae NOT DETECTED NOT DETECTED Final      Studies: Dg Chest 2 View  Result Date: 05/23/2017 CLINICAL DATA:  Hemoptysis.  Pulmonary fibrosis. EXAM:  CHEST  2 VIEW COMPARISON:  No prior . FINDINGS: Mediastinum and hilar structures are normal. Cardiomegaly. No pulmonary venous congestion . Diffuse interstitial prominence is noted bilaterally most consistent chronic interstitial lung disease given patient's history. Overlying active interstitial process cannot be excluded. No pleural effusion or pneumothorax. IMPRESSION: 1. Diffuse bilateral pulmonary interstitial prominence most likely chronic interstitial lung disease given the patient's history. Overlying active interstitial process cannot be excluded. 2. Cardiomegaly.  No pulmonary venous congestion. Electronically Signed   By: Maisie Fushomas  Register   On: 05/23/2017 09:03    Scheduled Meds: . amoxicillin-clavulanate  1 tablet Oral Q12H  . atorvastatin  20 mg Oral Daily  . dextromethorphan-guaiFENesin  1 tablet Oral BID  . diazepam  5 mg Oral BID  . feeding supplement (  ENSURE ENLIVE)  237 mL Oral BID BM  . gabapentin  800 mg Oral QID  . insulin aspart  0-9 Units Subcutaneous TID WC  . lubiprostone  24 mcg Oral BID WC  . venlafaxine XR  300 mg Oral Daily    Continuous Infusions:    LOS: 3 days     Laverna Peace, MD [facility] Pager [phone]  If 7PM-7AM, please contact night-coverage [email or web address] Password [email or web address] 05/23/2017, 1:24 PM

## 2017-05-23 NOTE — Progress Notes (Signed)
[facility] Attending Note  ADMISSION DATE:  05/20/2017  CONSULTATION DATE: 05/20/2017  REFERRING MD:  Oretha Milch, M.D. / [facility]  CHIEF COMPLAINT: Hemoptysis  Presenting HPI:  71 y.o. male with known history of pulmonary fibrosis followed and workup completed by [facility] system in [place], [place]. 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 Patient has underlying diastolic congestive heart failure, diabetes mellitus, and essential hypertension. Presented to outside hospital in 10/20 with hemoptysis. For further evaluation by pulmonary. Patient endorsed a 1 day history of a dark, brownish blood mixed in with a white sputum. Patient also endorsed worsening of his chronic headaches. Patient reported chronic shortness of breath Patient has prior history of asbestos exposure and no prior surgical lung biopsy. He is unable to provide further history on his workup for his pulmonary fibrosis. Patient does chronically use oxygen at 4 L per minute 24 hours a day.  Subjective:  Reports no increase in his baseline dyspnea. Cough is steadily becoming less productive. Denies any chest pain or pressure.  Review of Systems:  Denies any sore throat but did have some sinus congestion preceding his cough. No subjective fever, chills, or sweats.  Temp:  [97.6 F (36.4 C)-98.5 F (36.9 C)] 97.6 F (36.4 C) (10/23 0550) Pulse Rate:  [70-96] 70 (10/23 0550) Resp:  [18] 18 (10/23 0550) BP: (98-117)/(54-61) 105/54 (10/23 0550) SpO2:  [99 %-100 %] 100 % (10/23 0550) Weight:  [181 lb 12.8 oz (82.5 kg)] 181 lb 12.8 oz (82.5 kg) (10/23 0401)   General:  Awake. No distress. Alert. Caucasian male. Integument:  Warm & dry. No rash or bruising on exposed skin. HEENT:  No scleral icterus or injection. Pupils symmetric.  Pulmonary:  Bilateral crackles noted. Normal work of breathing on 4 L/m.  Cardiovascular:  Regular  rate. No JVD apprecaited. No edema. Abdomen:  Soft. Nontender. Normal bowel sounds. Neurological:  Cranial nerves grossly in tact. No meningismus. Moving all 4 extremities equally. Oriented x4.   CBC Latest Ref Rng & Units 05/22/2017 05/21/2017  WBC 4.0 - 10.5 K/uL 12.9(H) 11.0(H)  Hemoglobin 13.0 - 17.0 g/dL 13.5 12.5(L)  Hematocrit 39.0 - 52.0 % 41.1 37.6(L)  Platelets 150 - 400 K/uL 221 207   BMP Latest Ref Rng & Units 05/21/2017  Glucose 65 - 99 mg/dL 144(H)  BUN 6 - 20 mg/dL 11  Creatinine 0.61 - 1.24 mg/dL 0.88  Sodium 135 - 145 mmol/L 134(L)  Potassium 3.5 - 5.1 mmol/L 3.2(L)  Chloride 101 - 111 mmol/L 99(L)  CO2 22 - 32 mmol/L 27  Calcium 8.9 - 10.3 mg/dL 8.5(L)    IMAGING/STUDIES: TTE 12/11/15:  LV normal in size with mild concentric hypertrophy. EF is 55-60% with normal wall motion& abnormal diastolic function. LA & RA normal in size. RV normal in size. RVSP 49 mmHg. No evidence of aortic regurgitation. Aortic root normal in size.No evidence of mitral regurgitation. No evidence of tricuspid regurgitation. Normal pulmonic valve. CTA CHEST 10/20 (per radiologist):  Limited evaluation for pulmonary emboli due to respiratory motion. Groundglass nodular opacities throughout both lungs with worsening mediastinal adenopathy. Findings could be infectious, inflammatory, or neoplastic. Fibrotic changes within the lungs noted. Mild atherosclerotic change in the non-aneurysmal thoracic aorta. CXR PA/LAT 10/23:  Personally reviewed by me. Diffuse bilateral increased interstitial markings with some suggestion of peripheral areas of consolidation and alveolar filling. No air bronchograms are appreciated. Low lung findings. No pleural effusion  appreciated. Cardiomegaly is present with some widening of the mediastinum. No pleural thickening or plaque formation appreciated.  OUTSIDE LABS (05/20/17 [facility]): ABG on 4 L/m: 7.46/41.0/112/99.2% D dimer: 0.39 (normal 0-0.49) BMP:  135/4.1/96/29.2/8/0.73/21/9.2 LFT: 3.8/7.5/0.7/97/18/14 Troponin T:  <0.01 (repeated) N-terminal pro BNP: 441 (0-300) INR: 2.6 PTT: 42.5 CBC: 11.3/14.0/41.2/259  MICROBIOLOGY: Blood Cultures x2 10/21 >>> Sputum Culture 10/21 >>> Few GPC Sputum Culture 10/21 >>> Few GPC & GNR  ANTIBIOTICS: Rocephin 10/20 >>> Azithromycin 10/20 >>>  ASSESSMENT/PLAN:  71 y.o. male with history of interstitial lung disease/pulmonary fibrosis as well as prior asbestos exposure. Unclear previous workup. Found to have new groundglass opacities with ongoing hemoptysis.  1. Hemoptysis: Resolving. Suspect infectious etiology. Coumadin currently on hold. If Coumadin is to restart recommend bridging with heparin drip for patient safety. Checking autoimmune workup with ANA with reflex if positive, ANCA panel, & Anti-GBM. 2. Pulmonary fibrosis/ILD:  Possibly secondary to asbestos. Plan for follow up as outpatient with repeat high-resolution CT imaging. 3. Groundglass nodules:  Suspect infectious in etiology. Checking complete infectious workup with urine streptococcal and legionella antigens, respiratory viral panel PCR, & Procalcitonin. Recommend continuing Rocephin and azithromycin at this time.  I have spent a total of 39 minutes of time today caring for the patient, reviewing the patient's electronic medical record, and with more than 50% of that time spent coordinating care with the patient as well as reviewing the continuing plan of care with the patient at bedside.  Remainder of care as per primary service and other consultants.  Sonia Baller Ashok Cordia, M.D. [facility] Pager:  [phone] After 7pm or if no response, call [phone] 9:17 AM 05/23/17

## 2017-05-24 LAB — BASIC METABOLIC PANEL
Anion gap: 10 (ref 5–15)
BUN: 11 mg/dL (ref 6–20)
CALCIUM: 8.8 mg/dL — AB (ref 8.9–10.3)
CO2: 32 mmol/L (ref 22–32)
CREATININE: 0.82 mg/dL (ref 0.61–1.24)
Chloride: 97 mmol/L — ABNORMAL LOW (ref 101–111)
GFR calc Af Amer: >60 mL/min (ref >60)
GFR calc non Af Amer: >60 mL/min (ref >60)
GLUCOSE: 108 mg/dL — AB (ref 65–99)
Potassium: 3.7 mmol/L (ref 3.5–5.1)
Sodium: 139 mmol/L (ref 135–145)

## 2017-05-24 LAB — CBC
HEMATOCRIT: 37.3 % — AB (ref 39.0–52.0)
Hemoglobin: 11.9 g/dL — ABNORMAL LOW (ref 13.0–17.0)
MCH: 29 pg (ref 26.0–34.0)
MCHC: 31.9 g/dL (ref 30.0–36.0)
MCV: 90.8 fL (ref 78.0–100.0)
Platelets: 218 10*3/uL (ref 150–400)
RBC: 4.11 MIL/uL — ABNORMAL LOW (ref 4.22–5.81)
RDW: 13.2 % (ref 11.5–15.5)
WBC: 7.9 10*3/uL (ref 4.0–10.5)

## 2017-05-24 LAB — ENA+DNA/DS+ANTICH+CENTRO+JO...
Anti JO-1: <0.2 AI (ref 0.0–0.9)
Centromere Ab Screen: <0.2 AI (ref 0.0–0.9)
Ribonucleic Protein: <0.2 AI (ref 0.0–0.9)
SSA (Ro) (ENA) Antibody, IgG: <0.2 AI (ref 0.0–0.9)
ds DNA Ab: 10 IU/mL — ABNORMAL HIGH (ref 0–9)

## 2017-05-24 LAB — CULTURE, RESPIRATORY W GRAM STAIN

## 2017-05-24 LAB — ANCA TITERS

## 2017-05-24 LAB — GLUCOSE, CAPILLARY
GLUCOSE-CAPILLARY: 141 mg/dL — AB (ref 65–99)
GLUCOSE-CAPILLARY: 145 mg/dL — AB (ref 65–99)
Glucose-Capillary: 103 mg/dL — ABNORMAL HIGH (ref 65–99)
Glucose-Capillary: 130 mg/dL — ABNORMAL HIGH (ref 65–99)

## 2017-05-24 LAB — PROTIME-INR
INR: 1.23
Prothrombin Time: 15.4 seconds — ABNORMAL HIGH (ref 11.4–15.2)

## 2017-05-24 LAB — MPO/PR-3 (ANCA) ANTIBODIES
ANCA Proteinase 3: <3.5 U/mL (ref 0.0–3.5)
Myeloperoxidase Abs: <9 U/mL (ref 0.0–9.0)

## 2017-05-24 LAB — LEGIONELLA PNEUMOPHILA SEROGP 1 UR AG: L. PNEUMOPHILA SEROGP 1 UR AG: NEGATIVE

## 2017-05-24 LAB — HEPARIN LEVEL (UNFRACTIONATED): Heparin Unfractionated: <0.1 IU/mL — ABNORMAL LOW (ref 0.30–0.70)

## 2017-05-24 LAB — ANA W/REFLEX IF POSITIVE: ANA: POSITIVE — AB

## 2017-05-24 LAB — CULTURE, RESPIRATORY

## 2017-05-24 MED ORDER — HEPARIN (PORCINE) IN NACL 100-0.45 UNIT/ML-% IJ SOLN
2100.0000 [IU]/h | INTRAMUSCULAR | Status: DC
  Administered 2017-05-24: 1150 [IU]/h via INTRAVENOUS
  Administered 2017-05-25: 1350 [IU]/h via INTRAVENOUS
  Administered 2017-05-26: 1800 [IU]/h via INTRAVENOUS
  Filled 2017-05-24 (×3): qty 250

## 2017-05-24 MED ORDER — CEPHALEXIN 500 MG PO CAPS
500.0000 mg | ORAL_CAPSULE | Freq: Three times a day (TID) | ORAL | Status: DC
  Administered 2017-05-24 – 2017-05-26 (×7): 500 mg via ORAL
  Filled 2017-05-24 (×7): qty 1

## 2017-05-24 MED ORDER — WARFARIN - PHARMACIST DOSING INPATIENT: Freq: Every day | Status: DC

## 2017-05-24 MED ORDER — WARFARIN SODIUM 7.5 MG PO TABS
7.5000 mg | ORAL_TABLET | Freq: Once | ORAL | Status: AC
  Administered 2017-05-24: 7.5 mg via ORAL
  Filled 2017-05-24: qty 1

## 2017-05-24 NOTE — Progress Notes (Signed)
ANTICOAGULATION CONSULT NOTE - Initial Consult  Pharmacy Consult for Heparin > Coumadin Indication: hx CVA and cerebral sinus thrombosis  No Known Allergies  Patient Measurements: Height: 6\' 1"  (185.4 cm) Weight: 181 lb 12.8 oz (82.5 kg) IBW/kg (Calculated) : 79.9 Heparin Dosing Weight: 82 kg  Vital Signs: Temp: 98.2 F (36.8 C) (10/24 0539) Temp Source: Oral (10/24 0539) BP: 128/72 (10/24 0539) Pulse Rate: 84 (10/24 0539)  Labs:  Recent Labs  05/22/17 0738 05/24/17 0540 05/24/17 0819  HGB 13.5  --  11.9*  HCT 41.1  --  37.3*  PLT 221  --  218  LABPROT  --  15.4*  --   INR  --  1.23  --   CREATININE  --   --  0.82    Estimated Creatinine Clearance: 90.7 mL/min (by C-G formula based on SCr of 0.82 mg/dL).   Medical History: Past Medical History:  Diagnosis Date  . Abnormal myocardial perfusion study    Possible lateral ischemia March 2013  . Anxiety   . Cerebral venous thrombosis    MR venogram with patent dural sinuses 2009  . Chronic pain   . History of hematuria    Cystoscopy - follows with Urology  . Mixed hyperlipidemia   . Stroke [facility])    Recurrent by report - although head MRI negative 2009  . Type 2 diabetes mellitus (HCC)     Medications:  Prescriptions Prior to Admission  Medication Sig Dispense Refill Last Dose  . atorvastatin (LIPITOR) 20 MG tablet Take 20 mg by mouth daily.   05/19/2017  . diazepam (VALIUM) 5 MG tablet Take 5 mg by mouth 2 (two) times daily.   05/19/2017  . gabapentin (NEURONTIN) 800 MG tablet Take 800 mg by mouth 4 (four) times daily.   05/19/2017  . lisinopril (PRINIVIL,ZESTRIL) 5 MG tablet Take 5 mg by mouth daily.  3 05/19/2017  . lubiprostone (AMITIZA) 24 MCG capsule Take 1 capsule (24 mcg total) by mouth 2 (two) times daily with a meal. 42 capsule 0 05/19/2017  . nitroGLYCERIN (NITROSTAT) 0.4 MG SL tablet Place 1 tablet (0.4 mg total) under the tongue every 5 (five) minutes as needed for chest pain. 25 tablet 3 unknown  at prn  . oxyCODONE-acetaminophen (PERCOCET) 5-325 MG per tablet Take 1 tablet by mouth every 6 (six) hours as needed.   05/19/2017 at prn  . polyethylene glycol (MIRALAX / GLYCOLAX) packet Take 17 g by mouth daily.   05/19/2017 at Unknown time  . venlafaxine XR (EFFEXOR-XR) 150 MG 24 hr capsule Take 300 mg by mouth daily.   05/19/2017  . warfarin (COUMADIN) 6 MG tablet Take 6 mg by mouth as directed.    05/19/2017  . [DISCONTINUED] lisinopril (PRINIVIL,ZESTRIL) 5 MG tablet Take 1 tablet (5 mg total) by mouth daily. 90 tablet 3   . [DISCONTINUED] mirtazapine (REMERON) 30 MG tablet Take 15 mg by mouth at bedtime.   Not Taking    Assessment: 71 yo M on Coumadin PTA for hx of CVA and cerebral sinus thrombosis.  Pt initially presented to [facility] and was transferred to [facility] for acute onset of hemoptysis.  Anticoagulation has been held since admission.    INR today 1.23  Hemoptysis resolving.  Pharmacy has been asked to resume Coumadin with heparin bridging.  Goal of Therapy:  INR 2-3 Heparin level 0.3-0.7 units/ml Monitor platelets by anticoagulation protocol: Yes   Plan:  Start heparin at 1150 units/hr Heparin level in 8 hours Coumadin 7.5  mg PO x 1 tonight Heparin level, INR, and CBC daily  Toys 'R' UsKimberly Square Jowett, Pharm.D., BCPS Clinical Pharmacist Pager: [phone] Clinical phone for 05/24/2017 from 8:30-4:00 is [phone]. After 4pm, please call Main Rx [phone]) for assistance. 05/24/2017 1:23 PM

## 2017-05-24 NOTE — Progress Notes (Signed)
ANTICOAGULATION CONSULT NOTE - Follow Up Consult  Pharmacy Consult for Heparin (while INR sub-therapeutic) Indication: Sinus thrombosis/Hx CVA  No Known Allergies  Patient Measurements: Height: 6\' 1"  (185.4 cm) Weight: 181 lb 12.8 oz (82.5 kg) IBW/kg (Calculated) : 79.9  Vital Signs: Temp: 97.6 F (36.4 C) (10/24 2036) Temp Source: Oral (10/24 2036) BP: 100/52 (10/24 2159) Pulse Rate: 83 (10/24 2036)  Labs:  Recent Labs  05/22/17 0738 05/24/17 0540 05/24/17 0819 05/24/17 2140  HGB 13.5  --  11.9*  --   HCT 41.1  --  37.3*  --   PLT 221  --  218  --   LABPROT  --  15.4*  --   --   INR  --  1.23  --   --   HEPARINUNFRC  --   --   --  <0.10*  CREATININE  --   --  0.82  --     Estimated Creatinine Clearance: 90.7 mL/min (by C-G formula based on SCr of 0.82 mg/dL).  Assessment: Anti-coagulation resumed 10/24 after being held due to hemoptysis which is now resolving. Heparin level tonight is sub-therapeutic. No issues per RN.   Goal of Therapy:  Heparin level 0.3-0.7 units/ml Monitor platelets by anticoagulation protocol: Yes   Plan:  -Inc heparin to 1350 units/hr -0800 HL  Joel Santos, Joel Santos 05/24/2017,11:30 PM

## 2017-05-24 NOTE — Progress Notes (Addendum)
Joel Santos, M.D. / [facility]  CHIEF COMPLAINT: Hemoptysis  Presenting HPI:  71 y.o. male with known history of pulmonary fibrosis followed and workup completed by [facility] in [place], [place]. 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 Patient has underlying diastolic congestive heart failure, diabetes mellitus, and essential hypertension. Presented to outside [facility] in 10/20 with hemoptysis. For further evaluation by pulmonary. Patient endorsed a 1 day history of a dark, brownish blood mixed in with a white sputum. Patient also endorsed worsening of his chronic headaches. Patient reported chronic shortness of breath Patient has prior history of asbestos exposure and no prior surgical lung biopsy. He is unable to provide further history on his workup for his pulmonary fibrosis. Patient does chronically use oxygen at 4 L per minute 24 hours a day.  Subjective:  Patient has not yet ambulated in the hallway. Reports minimal blood-tinged to mucousy produce this morning. Did have a severe coughing spell this morning. No chest pain or pressure.  Review of Systems:  Denies any subjective fever or chills. No abdominal pain or nausea.  Temp:  [98.2 F (36.8 C)-99 F (37.2 C)] 98.2 F (36.8 C) (10/24 0539) Pulse Rate:  [84-91] 84 (10/24 0539) Resp:  [16-18] 18 (10/24 0539) BP: (84-128)/(65-73) 128/72 (10/24 0539) SpO2:  [93 %-99 %] 93 % (10/24 0539)   General:  Awake. No distress. Laying fully recumbent.  Integument:  Warm. Dry. No rash appreciated.  HEENT:  No scleral icterus. No scleral injection. Moist mucous membranes.  Pulmonary:  Basilar crackles. Normal work of breathing on supplemental oxygen. Speaking in complete sentences.  Cardiovascular: Regular rate. No edema. No JVD  appreciated. Abdomen:  Nontender. Nondistended. Normal bowel sounds. Neurological:  No meningismus. Grossly nonfocal. Moving all 4 extremities equally.   CBC Latest Ref Rng & Units 05/24/2017 05/22/2017 05/21/2017  WBC 4.0 - 10.5 K/uL 7.9 12.9(H) 11.0(H)  Hemoglobin 13.0 - 17.0 g/dL 11.9(L) 13.5 12.5(L)  Hematocrit 39.0 - 52.0 % 37.3(L) 41.1 37.6(L)  Platelets 150 - 400 K/uL 218 221 207   BMP Latest Ref Rng & Units 05/24/2017 05/21/2017  Glucose 65 - 99 mg/dL 108(H) 144(H)  BUN 6 - 20 mg/dL 11 11  Creatinine 0.61 - 1.24 mg/dL 0.82 0.88  Sodium 135 - 145 mmol/L 139 134(L)  Potassium 3.5 - 5.1 mmol/L 3.7 3.2(L)  Chloride 101 - 111 mmol/L 97(L) 99(L)  CO2 22 - 32 mmol/L 32 27  Calcium 8.9 - 10.3 mg/dL 8.8(L) 8.5(L)    IMAGING/STUDIES: TTE 12/11/15:  LV normal in size with mild concentric hypertrophy. EF is 55-60% with normal wall motion& abnormal diastolic function. LA & RA normal in size. RV normal in size. RVSP 49 mmHg. No evidence of aortic regurgitation. Aortic root normal in size.No evidence of mitral regurgitation. No evidence of tricuspid regurgitation. Normal pulmonic valve. CTA CHEST 10/20 (per radiologist):  Limited evaluation for pulmonary emboli due to respiratory motion. Groundglass nodular opacities throughout both lungs with worsening mediastinal adenopathy. Findings could be infectious, inflammatory, or neoplastic. Fibrotic changes within the lungs noted. Mild atherosclerotic change in the non-aneurysmal thoracic aorta. CXR PA/LAT 10/23:  Previously reviewed by me. Diffuse bilateral increased interstitial markings with some suggestion of peripheral areas of consolidation and alveolar filling. No air bronchograms are appreciated. Low lung findings. No pleural effusion appreciated. Cardiomegaly is present  with some widening of the mediastinum. No pleural thickening or plaque formation appreciated.  OUTSIDE LABS (05/20/17 [facility]): ABG on 4 L/m: 7.46/41.0/112/99.2% D  dimer: 0.39 (normal 0-0.49) BMP: 135/4.1/96/29.2/8/0.73/21/9.2 LFT: 3.8/7.5/0.7/97/18/14 Troponin T:  <0.01 (repeated) N-terminal pro BNP: 441 (0-300) INR: 2.6 PTT: 42.5 CBC: 11.3/14.0/41.2/259  MICROBIOLOGY: Blood Cultures x2 10/21 >>> Sputum Culture 10/21:  Rare MSSA Sputum Culture 10/21 >>> Few GPC & GNR Urine Streptococcal Antigen 10/23:  Negative Respiratory Viral Panel PCR 10/23:  Negative  Urine Legionella Antigen 10/23 >>>  SEROLOGIES (10/23): ANA >>> Anti-GBM >>> MPO >>> PR-3 >>> C-ANCA >>> P-ANCA >>> Atypical ANCA >>>  ANTIBIOTICS: Rocephin 10/20 - 10/24 Azithromycin 10/20 - 10/24 Keflex 10/24 >>>  ASSESSMENT/PLAN:  71 y.o. male with history of interstitial lung disease/pulmonary fibrosis and prior asbestos exposure. Bruising was groundglass opacities and hemoptysis. Hemoptysis continuing to improve. Sputum culture has yielded MSSA which may be a true infectious specimen given limited squamous epithelials noted on Gram stain. As such, I am transitioning over to oral Keflex. Clinically he is improving. As such, I have made a follow-up appointment with our office anticipating discharge in the next day or so.  1. Hemoptysis:  Resolving. Likely due to infection given sputum culture. Awaiting finalization of remainder of infectious workup & autoimmune workup. Defer to primary service on restarting Coumadin. Recommend bridging with heparin drip for patient safety if Coumadin still needed.  2. Pulmonary fibrosis/ILD: Likely secondary to asbestos. Plan for follow up as outpatient with repeat high-resolution CT imaging.  3. Groundglass nodules: Likely secondary to MSSA infection. Currently on day #5 of antibiotic therapy with Rocephin & azithromycin. Switching over to Keflex 500mg  PO tid x 5 days.  4. Follow-up:  Patient scheduled for follow-up on 10/31 at 11am in our office.   Remainder of care as per primary service and other consultants.  Sonia Baller Ashok Cordia, M.D. [facility]  Pulmonary & Critical Care Pager:  [phone] After 7pm or if no response, call [phone] 11:05 AM 05/24/17

## 2017-05-24 NOTE — Progress Notes (Signed)
PROGRESS NOTE    Joel Santos  ZOX:[id number] DOB: 1946/05/24 DOA: 05/20/2017 PCP: Juliette Alcide, MD     Brief Narrative:  Joel Santos is a 71 year old male with medical history significant for chronic pulmonary fibrosis ([facility] [place]), remote history of CVA and cerebral sinus thrombosis on Coumadin, hypertension, and diabetes who presented to the ED as a transfer from [facility] for acute onset of hemoptysis for one day and was found to have new groundglass opacities concerning for pneumonia. CTA on admission was negative for PE but concerning for worsening mediastinal adenopathy in the setting of known pulmonary fibrosis in addition to scattered groundglass opacities. Since admission, patient's Coumadin has been on hold with decreased frequency of episodes of hemoptysis. Patient was empirically treated with azithromycin and ceftriaxone for CAP coverage. Pulmonology has been consulted.   Assessment & Plan:   Principal Problem:   Hemoptysis Active Problems:   Type 2 diabetes mellitus (HCC)   History of Coumadin therapy   Pulmonary fibrosis, unspecified (HCC)   Respiratory failure, acute-on-chronic (HCC)   Malnutrition of moderate degree   Acute on chronic hypoxic respiratory failure secondary to CAP as well as underlying ILD and pulmonary fibrosis  -CTA negative for PE but concerning for worsening mediastinal adenopathy in setting of known pulmonary fibrosis.  In addition to the scattered groundglass opacities. 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 Resume coumadin, heparin gtt to bridge    Chronic diastolic CHF with preserved ejection fraction, euvolemic -Stable   HLD -Continue lipitor   Depression -Continue effexor   Non-severe (moderate malnutrition), in context of chronic illness -Continue Ensure supplementation   DVT prophylaxis: heparin gtt and coumadin to resume Code Status: Full Family Communication: updated daughter over the phone today Disposition Plan: pending improvement, PT/OT eval   Consultants:   PCCM  Procedures:   None   Antimicrobials:  Anti-infectives    Start     Dose/Rate Route Frequency Ordered Stop   05/24/17 1400  cephALEXin (KEFLEX) capsule 500 mg     500 mg Oral Every 8 hours 05/24/17 1228     05/23/17 2000  azithromycin (ZITHROMAX) 500 mg in dextrose 5 % 250 mL IVPB  Status:  Discontinued     500 mg 250 mL/hr over 60 Minutes Intravenous Every 24 hours 05/23/17 1345 05/24/17 1229   05/23/17 1800  cefTRIAXone (ROCEPHIN) 1 g in dextrose 5 % 50 mL IVPB  Status:  Discontinued     1 g 100 mL/hr over 30 Minutes Intravenous Every 24 hours 05/23/17 1512 05/24/17 1228   05/23/17 1000  amoxicillin-clavulanate (AUGMENTIN) 875-125 MG per tablet 1 tablet  Status:  Discontinued     1 tablet Oral Every 12 hours 05/23/17 0933 05/23/17 1511   05/21/17 2000  azithromycin (ZITHROMAX) 500 mg in dextrose 5 % 250 mL IVPB  Status:  Discontinued     500 mg 250 mL/hr over 60 Minutes Intravenous Every 24 hours 05/21/17 0039 05/23/17 0933   05/21/17 1800  cefTRIAXone (ROCEPHIN) 1 g in dextrose 5 % 50 mL IVPB  Status:  Discontinued     1 g 100 mL/hr over 30 Minutes Intravenous Every 24 hours 05/21/17 0039 05/23/17  [id number]       Subjective: Patient feeling well today. He states that he had one time episode of blood in his sputum, which is at bedside. Otherwise, complaining of some irritation on his right outer ear from his nasal cannula O2. Denies any worsening shortness of breath. No other complaints such as abdominal pain, nausea, vomiting or  diarrhea.  Objective: Vitals:   05/23/17 1439 05/23/17 2107 05/23/17 2209 05/24/17 0539  BP: 98/65 (!) 84/73 110/65 128/72  Pulse: 91 91 91 84  Resp: 16 18  18   Temp: 98.3 F (36.8 C) 99 F (37.2 C)  98.2 F (36.8 C)  TempSrc: Oral Oral  Oral  SpO2: 98% 99%  93%  Weight:      Height:        Intake/Output Summary (Last 24 hours) at 05/24/17 1447 Last data filed at 05/24/17 0559  Gross per 24 hour  Intake              360 ml  Output              750 ml  Net             -390 ml   Filed Weights   05/21/17 0054 05/22/17 0615 05/23/17 0401  Weight: 82.1 kg (180 lb 14.4 oz) 82 kg (180 lb 12.4 oz) 82.5 kg (181 lb 12.8 oz)    Examination:  General exam: Appears calm and comfortable  Respiratory system: On [place] O2, bilateral crackles, no resp distress  Cardiovascular system: S1 & S2 heard. No JVD, murmurs, rubs, gallops or clicks. No pedal edema. Gastrointestinal system: Abdomen is nondistended, soft and nontender. No organomegaly or masses felt. Normal bowel sounds heard. Central nervous system: Alert and oriented. No focal neurological deficits. Extremities: Symmetric 5 x 5 power. Skin: No rashes, lesions or ulcers Psychiatry: Judgement and insight appear normal. Mood & affect appropriate.   Data Reviewed: I have personally reviewed following labs and imaging studies  CBC:  Recent Labs Lab 05/21/17 0229 05/22/17 0738 05/24/17 0819  WBC 11.0* 12.9* 7.9  HGB 12.5* 13.5 11.9*  HCT 37.6* 41.1 37.3*  MCV 88.7 90.5 90.8  PLT 207 221 218   Basic Metabolic Panel:  Recent Labs Lab 05/21/17 0229 05/24/17 0819  NA 134* 139  K 3.2* 3.7  CL 99* 97*  CO2 27 32  GLUCOSE 144* 108*  BUN 11 11  CREATININE 0.88 0.82  CALCIUM 8.5* 8.8*   GFR: Estimated Creatinine Clearance: 90.7 mL/min (by C-G formula based on SCr of 0.82 mg/dL). Liver Function Tests: No results for input(s): AST, ALT, ALKPHOS, BILITOT, PROT, ALBUMIN in the last 168 hours. No results for input(s): LIPASE,  AMYLASE in the last 168 hours. No results for input(s): AMMONIA in the last 168 hours. Coagulation Profile:  Recent Labs Lab 05/21/17 1308 05/24/17 0540  INR 2.62 1.23   Cardiac Enzymes: No results for input(s): CKTOTAL, CKMB, CKMBINDEX, TROPONINI in the last 168 hours. BNP (last 3 results) No results for input(s): PROBNP in the last 8760 hours. HbA1C: No results for input(s): HGBA1C in the last 72 hours. CBG:  Recent Labs Lab 05/23/17 1225 05/23/17 1658 05/23/17 2105 05/24/17 0750 05/24/17 1219  GLUCAP 143* 127* 108* 103* 130*   Lipid Profile: No results for input(s): CHOL, HDL, LDLCALC, TRIG, CHOLHDL, LDLDIRECT in the last 72 hours. Thyroid Function Tests: No results for input(s): TSH, T4TOTAL, FREET4, T3FREE, THYROIDAB in the last 72 hours. Anemia Panel: No results for input(s): VITAMINB12, FOLATE, FERRITIN,  TIBC, IRON, RETICCTPCT in the last 72 hours. Sepsis Labs:  Recent Labs Lab 05/23/17 1105  PROCALCITON <0.10    Recent Results (from the past 240 hour(s))  Culture, blood (routine x 2) Call MD if unable to obtain prior to antibiotics being given     Status: None (Preliminary result)   Collection Time: 05/21/17  2:29 AM  Result Value Ref Range Status   Specimen Description BLOOD LEFT ARM  Final   Special Requests IN PEDIATRIC BOTTLE Blood Culture adequate volume  Final   Culture NO GROWTH 3 DAYS  Final   Report Status PENDING  Incomplete  Culture, blood (routine x 2) Call MD if unable to obtain prior to antibiotics being given     Status: None (Preliminary result)   Collection Time: 05/21/17  2:33 AM  Result Value Ref Range Status   Specimen Description BLOOD RIGHT ARM  Final   Special Requests IN PEDIATRIC BOTTLE Blood Culture adequate volume  Final   Culture NO GROWTH 3 DAYS  Final   Report Status PENDING  Incomplete  Culture, sputum-assessment     Status: None   Collection Time: 05/21/17 12:00 PM  Result Value Ref Range Status   Specimen Description  SPUTUM  Final   Special Requests NONE  Final   Sputum evaluation THIS SPECIMEN IS ACCEPTABLE FOR SPUTUM CULTURE  Final   Report Status 05/21/2017 FINAL  Final  Culture, respiratory (NON-Expectorated)     Status: None (Preliminary result)   Collection Time: 05/21/17 12:00 PM  Result Value Ref Range Status   Specimen Description SPUTUM  Final   Special Requests NONE Reflexed from [id number]  Final   Gram Stain   Final    MODERATE WBC PRESENT, PREDOMINANTLY PMN FEW SQUAMOUS EPITHELIAL CELLS PRESENT RARE GRAM POSITIVE COCCI    Culture RARE STAPHYLOCOCCUS AUREUS MODERATE YEAST   Final   Report Status PENDING  Incomplete   Organism ID, Bacteria STAPHYLOCOCCUS AUREUS  Final      Susceptibility   Staphylococcus aureus - MIC*    CIPROFLOXACIN <=0.5 SENSITIVE Sensitive     ERYTHROMYCIN <=0.25 SENSITIVE Sensitive     GENTAMICIN <=0.5 SENSITIVE Sensitive     OXACILLIN 0.5 SENSITIVE Sensitive     TETRACYCLINE <=1 SENSITIVE Sensitive     VANCOMYCIN <=0.5 SENSITIVE Sensitive     TRIMETH/SULFA <=10 SENSITIVE Sensitive     CLINDAMYCIN <=0.25 SENSITIVE Sensitive     RIFAMPIN <=0.5 SENSITIVE Sensitive     Inducible Clindamycin NEGATIVE Sensitive     * RARE STAPHYLOCOCCUS AUREUS  Culture, expectorated sputum-assessment     Status: None   Collection Time: 05/21/17  1:00 PM  Result Value Ref Range Status   Specimen Description EXPECTORATED SPUTUM  Final   Special Requests NONE  Final   Sputum evaluation THIS SPECIMEN IS ACCEPTABLE FOR SPUTUM CULTURE  Final   Report Status 05/22/2017 FINAL  Final  Culture, respiratory (NON-Expectorated)     Status: None (Preliminary result)   Collection Time: 05/21/17  1:00 PM  Result Value Ref Range Status   Specimen Description EXPECTORATED SPUTUM  Final   Special Requests NONE Reflexed from [id number]  Final   Gram Stain   Final    MODERATE WBC PRESENT, PREDOMINANTLY PMN FEW SQUAMOUS EPITHELIAL CELLS PRESENT FEW GRAM POSITIVE COCCI IN CLUSTERS FEW GRAM NEGATIVE  RODS    Culture CULTURE REINCUBATED FOR BETTER GROWTH  Final   Report Status PENDING  Incomplete  Respiratory Panel by PCR     Status: None  Collection Time: 05/23/17 10:45 AM  Result Value Ref Range Status   Adenovirus NOT DETECTED NOT DETECTED Final   Coronavirus 229E NOT DETECTED NOT DETECTED Final   Coronavirus HKU1 NOT DETECTED NOT DETECTED Final   Coronavirus NL63 NOT DETECTED NOT DETECTED Final   Coronavirus OC43 NOT DETECTED NOT DETECTED Final   Metapneumovirus NOT DETECTED NOT DETECTED Final   Rhinovirus / Enterovirus NOT DETECTED NOT DETECTED Final   Influenza A NOT DETECTED NOT DETECTED Final   Influenza B NOT DETECTED NOT DETECTED Final   Parainfluenza Virus 1 NOT DETECTED NOT DETECTED Final   Parainfluenza Virus 2 NOT DETECTED NOT DETECTED Final   Parainfluenza Virus 3 NOT DETECTED NOT DETECTED Final   Parainfluenza Virus 4 NOT DETECTED NOT DETECTED Final   Respiratory Syncytial Virus NOT DETECTED NOT DETECTED Final   Bordetella pertussis NOT DETECTED NOT DETECTED Final   Chlamydophila pneumoniae NOT DETECTED NOT DETECTED Final   Mycoplasma pneumoniae NOT DETECTED NOT DETECTED Final       Radiology Studies: Dg Chest 2 View  Result Date: 05/23/2017 CLINICAL DATA:  Hemoptysis.  Pulmonary fibrosis. EXAM: CHEST  2 VIEW COMPARISON:  No prior . FINDINGS: Mediastinum and hilar structures are normal. Cardiomegaly. No pulmonary venous congestion . Diffuse interstitial prominence is noted bilaterally most consistent chronic interstitial lung disease given patient's history. Overlying active interstitial process cannot be excluded. No pleural effusion or pneumothorax. IMPRESSION: 1. Diffuse bilateral pulmonary interstitial prominence most likely chronic interstitial lung disease given the patient's history. Overlying active interstitial process cannot be excluded. 2. Cardiomegaly.  No pulmonary venous congestion. Electronically Signed   By: Maisie Fus  Register   On: 05/23/2017 09:03       Scheduled Meds: . atorvastatin  20 mg Oral Daily  . cephALEXin  500 mg Oral Q8H  . dextromethorphan-guaiFENesin  1 tablet Oral BID  . diazepam  5 mg Oral BID  . feeding supplement (ENSURE ENLIVE)  237 mL Oral BID BM  . gabapentin  800 mg Oral QID  . insulin aspart  0-9 Units Subcutaneous TID WC  . lubiprostone  24 mcg Oral BID WC  . venlafaxine XR  300 mg Oral Daily  . warfarin  7.5 mg Oral ONCE-1800  . Warfarin - Pharmacist Dosing Inpatient   Does not apply q1800   Continuous Infusions: . heparin 1,150 Units/hr (05/24/17 1344)     LOS: 4 days    Time spent: 40 minutes   Noralee Stain, DO [facility] [email or web address] Password [email or web address] 05/24/2017, 2:47 PM

## 2017-05-25 LAB — BASIC METABOLIC PANEL
ANION GAP: 9 (ref 5–15)
BUN: 12 mg/dL (ref 6–20)
CALCIUM: 8.5 mg/dL — AB (ref 8.9–10.3)
CO2: 31 mmol/L (ref 22–32)
Chloride: 99 mmol/L — ABNORMAL LOW (ref 101–111)
Creatinine, Ser: 0.72 mg/dL (ref 0.61–1.24)
Glucose, Bld: 101 mg/dL — ABNORMAL HIGH (ref 65–99)
POTASSIUM: 3.6 mmol/L (ref 3.5–5.1)
Sodium: 139 mmol/L (ref 135–145)

## 2017-05-25 LAB — HEPARIN LEVEL (UNFRACTIONATED): Heparin Unfractionated: <0.1 IU/mL — ABNORMAL LOW (ref 0.30–0.70)

## 2017-05-25 LAB — CBC
HEMATOCRIT: 33.7 % — AB (ref 39.0–52.0)
HEMOGLOBIN: 10.7 g/dL — AB (ref 13.0–17.0)
MCH: 28.7 pg (ref 26.0–34.0)
MCHC: 31.8 g/dL (ref 30.0–36.0)
MCV: 90.3 fL (ref 78.0–100.0)
Platelets: 209 10*3/uL (ref 150–400)
RBC: 3.73 MIL/uL — AB (ref 4.22–5.81)
RDW: 13.2 % (ref 11.5–15.5)
WBC: 5.9 10*3/uL (ref 4.0–10.5)

## 2017-05-25 LAB — CULTURE, RESPIRATORY

## 2017-05-25 LAB — GLUCOSE, CAPILLARY
GLUCOSE-CAPILLARY: 90 mg/dL (ref 65–99)
Glucose-Capillary: 124 mg/dL — ABNORMAL HIGH (ref 65–99)
Glucose-Capillary: 135 mg/dL — ABNORMAL HIGH (ref 65–99)
Glucose-Capillary: 112 mg/dL — ABNORMAL HIGH (ref 65–99)

## 2017-05-25 LAB — GLOMERULAR BASEMENT MEMBRANE ANTIBODIES: GBM AB: 4 U (ref 0–20)

## 2017-05-25 LAB — CULTURE, RESPIRATORY W GRAM STAIN: Culture: NORMAL

## 2017-05-25 LAB — PROTIME-INR
INR: 1.2
PROTHROMBIN TIME: 15.1 s (ref 11.4–15.2)

## 2017-05-25 MED ORDER — WARFARIN SODIUM 7.5 MG PO TABS
7.5000 mg | ORAL_TABLET | Freq: Once | ORAL | Status: AC
  Administered 2017-05-25: 7.5 mg via ORAL
  Filled 2017-05-25: qty 1

## 2017-05-25 NOTE — Progress Notes (Signed)
[facility] Attending Note  ADMISSION DATE:  05/20/2017  CONSULTATION DATE: 05/20/2017  REFERRING MD:  Oretha Milch, M.D. / [employer] [facility]  CHIEF COMPLAINT: Hemoptysis  Presenting HPI:  71 y.o. male with known history of pulmonary fibrosis followed and workup completed by [facility] system in [place], [place]. 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 Patient has underlying diastolic congestive heart failure, diabetes mellitus, and essential hypertension. Presented to outside hospital in 10/20 with hemoptysis. For further evaluation by pulmonary. Patient endorsed a 1 day history of a dark, brownish blood mixed in with a white sputum. Patient also endorsed worsening of his chronic headaches. Patient reported chronic shortness of breath Patient has prior history of asbestos exposure and no prior surgical lung biopsy. He is unable to provide further history on his workup for his pulmonary fibrosis. Patient does chronically use oxygen at 4 L per minute 24 hours a day.  Subjective:   Feels better, wants to go home, no distress, no hemoptysis since yesterday  Temp:  [97.6 F (36.4 C)-98.1 F (36.7 C)] 97.8 F (36.6 C) (10/25 0422) Pulse Rate:  [65-90] 69 (10/25 0422) Resp:  [18-20] 20 (10/25 0422) BP: (96-107)/(46-67) 106/61 (10/25 0422) SpO2:  [97 %-100 %] 99 % (10/25 0422) Weight:  [182 lb 15.7 oz (83 kg)] 182 lb 15.7 oz (83 kg) (10/25 0334)   4 L via nasal cannula  General: 71 year old white male sitting upright in bed.  He is in no distress. HEENT: Normocephalic atraumatic, no JVD his mucous membranes are moist Pulmonary: No accessory muscle use, speaking in full sentences, dry Velcro crackles posteriorly and throughout. Cardiac: Regular rate and rhythm without murmur rub or gallop Abdomen: Soft nontender positive bowel sounds no organomegaly Extremities/musculoskeletal: Warm dry brisk cap refill no  significant edema Neuro: Awake oriented no focal deficits  CBC Latest Ref Rng & Units 05/25/2017 05/24/2017 05/22/2017  WBC 4.0 - 10.5 K/uL 5.9 7.9 12.9(H)  Hemoglobin 13.0 - 17.0 g/dL 10.7(L) 11.9(L) 13.5  Hematocrit 39.0 - 52.0 % 33.7(L) 37.3(L) 41.1  Platelets 150 - 400 K/uL 209 218 221   BMP Latest Ref Rng & Units 05/25/2017 05/24/2017 05/21/2017  Glucose 65 - 99 mg/dL 101(H) 108(H) 144(H)  BUN 6 - 20 mg/dL 12 11 11   Creatinine 0.61 - 1.24 mg/dL 0.72 0.82 0.88  Sodium 135 - 145 mmol/L 139 139 134(L)  Potassium 3.5 - 5.1 mmol/L 3.6 3.7 3.2(L)  Chloride 101 - 111 mmol/L 99(L) 97(L) 99(L)  CO2 22 - 32 mmol/L 31 32 27  Calcium 8.9 - 10.3 mg/dL 8.5(L) 8.8(L) 8.5(L)   Lab Results  Component Value Date   INR 1.20 05/25/2017   INR 1.23 05/24/2017   INR 2.62 05/21/2017   IMAGING/STUDIES: TTE 12/11/15:  LV normal in size with mild concentric hypertrophy. EF is 55-60% with normal wall motion& abnormal diastolic function. LA & RA normal in size. RV normal in size. RVSP 49 mmHg. No evidence of aortic regurgitation. Aortic root normal in size.No evidence of mitral regurgitation. No evidence of tricuspid regurgitation. Normal pulmonic valve. CTA CHEST 10/20 (per radiologist):  Limited evaluation for pulmonary emboli due to respiratory motion. Groundglass nodular opacities throughout both lungs with worsening mediastinal adenopathy. Findings could be infectious, inflammatory, or neoplastic. Fibrotic changes within the lungs noted. Mild atherosclerotic change in the non-aneurysmal thoracic aorta. CXR PA/LAT 10/23:  Previously reviewed by me. Diffuse bilateral increased interstitial markings with some suggestion of peripheral  areas of consolidation and alveolar filling. No air bronchograms are appreciated. Low lung findings. No pleural effusion appreciated. Cardiomegaly is present with some widening of the mediastinum. No pleural thickening or plaque formation appreciated.  OUTSIDE LABS (05/20/17 [facility]): ABG on 4 L/m: 7.46/41.0/112/99.2% D dimer: 0.39 (normal 0-0.49) BMP: 135/4.1/96/29.2/8/0.73/21/9.2 LFT: 3.8/7.5/0.7/97/18/14 Troponin T:  <0.01 (repeated) N-terminal pro BNP: 441 (0-300) INR: 2.6 PTT: 42.5 CBC: 11.3/14.0/41.2/259  MICROBIOLOGY: Blood Cultures x2 10/21 >>> Sputum Culture 10/21:  Rare MSSA Sputum Culture 10/21 >>> Few GPC & GNR Urine Streptococcal Antigen 10/23:  Negative Respiratory Viral Panel PCR 10/23:  Negative  Urine Legionella Antigen 10/23 >>>neg  SEROLOGIES (10/23): ANA >>> positive  Ds DNA ab: positive  Anti-GBM >>> MPO >>>neg PR-3 >>> neg C-ANCA >>>neg P-ANCA >>>neg Atypical ANCA >>>  ANTIBIOTICS: Rocephin 10/20 - 10/24 Azithromycin 10/20 - 10/24 Keflex 10/24 >>>  ASSESSMENT/PLAN:   Hemoptysis Groundglass pulmonary nodules History of pulmonary fibrosis/interstitial lung disease Acute on chronic respiratory failure MSSA community-acquired pneumonia Cerebral venous thrombosis requiring chronic anticoagulation Chronic diastolic heart failure Depression Hyperlipidemia   Hemoptysis/community-acquired pneumonia (MSSA):  Resolving. Likely due to infection given sputum culture.  -pan sensitive SA from sputum -RVP negative -U antigens negative Plan Cont abx as planned (to get 5d keflex; which will complete 10d total rx) Resumed coumadin w/heparin bridge  Groundglass pulmonary nodules. Presumed d/t infection Plan Keflex as above   Pulmonary fibrosis/ILD: Likely secondary to asbestos.  Plan  Has f/u on 10/31   71 y.o. male with history of interstitial lung disease/pulmonary fibrosis and prior asbestos exposure. Bruising was groundglass opacities and hemoptysis. Hemoptysis continuing to improve. Sputum culture has yielded MSSA which may be a true infectious specimen given limited squamous epithelials noted on Gram stain.  He is continuing to do better from a pulmonary standpoint.  His hemoptysis has resolved with treatment  of infection.  He has tolerated anticoagulation with heparin.  Currently working on bridging anticoagulation.  Appears as though this is barrier to his discharge currently.  We will be available as needed, he has follow-up with [place] on 10/31 call if needed prior to that. Remainder of care as per primary service and other consultants.  Erick Colace ACNP-BC [place] Pager # [phone] OR # [phone] if no answer

## 2017-05-25 NOTE — Evaluation (Signed)
Physical Therapy Evaluation Patient Details Name: Joel Santos MRN: [id number] DOB: 10/17/1945 Today's Date: 05/25/2017   History of Present Illness  Pt is a 71 y/o male admitted secondary to hemoptysis and CAP. Chest imaging showed ground glass opacities and interstitial lung disease. PMH includes pulmonary fibrosis on 4L of oxygen at home, CVA, dCHF, DM, HTN, and DVT.   Clinical Impression  Pt admitted secondary to problem above with deficits below. PTA, pt was sometimes using RW, however, was limited in ambulation distance secondary to SOB. Upon eval, pt limited in gait tolerance secondary to SOB, weakness, and slightly decreased balance. DOE at 2/4. Oxygen sats decreasing to 87% on 4L, however, with seated rest and pursed lip breathing pt sats increased to 91% on 4L. Required min guard for mobility this session. Reports wife will be able to assist as needed upon d/c home. Recommending HHPT at d/c to increase independence and safety with functional mobility. Will continue to follow acutely and progress ambulation according to pt tolerance.     Follow Up Recommendations Home health PT;Supervision for mobility/OOB    Equipment Recommendations  None recommended by PT    Recommendations for Other Services       Precautions / Restrictions Precautions Precautions: Fall Restrictions Weight Bearing Restrictions: No      Mobility  Bed Mobility Overal bed mobility: Modified Independent             General bed mobility comments: Increased time and use of elevated HOB and bed rails. No external assist required.   Transfers Overall transfer level: Needs assistance Equipment used: Rolling walker (2 wheeled) Transfers: Sit to/from Stand Sit to Stand: Min guard         General transfer comment: Min guard for safety. Increased effort required. Cues for safe hand placement.   Ambulation/Gait Ambulation/Gait assistance: Min guard Ambulation Distance (Feet): 40 Feet Assistive  device: Rolling walker (2 wheeled) Gait Pattern/deviations: Step-through pattern;Decreased stride length;Trunk flexed Gait velocity: Decreased Gait velocity interpretation: Below normal speed for age/gender General Gait Details: Slow, overall steady gait with use of RW. Cues for proximity to device. Limited tolerance for ambulation secondary to fatigue and SOB. DOE at 2/4. Pt oxygen sats decreased to 87% on 4L during ambulation, however, increased to 91% on 4L with seated rest and cues for pursed lip breathing.   Stairs Stairs:  (Verbally reviewed stair navigation technique. )          Wheelchair Mobility    Modified Rankin (Stroke Patients Only)       Balance Overall balance assessment: Needs assistance Sitting-balance support: No upper extremity supported;Feet supported Sitting balance-Leahy Scale: Good     Standing balance support: Bilateral upper extremity supported;During functional activity Standing balance-Leahy Scale: Poor Standing balance comment: Reliant on UE support for balance.                              Pertinent Vitals/Pain Pain Assessment: No/denies pain    Home Living Family/patient expects to be discharged to:: Private residence Living Arrangements: Spouse/significant other Available Help at Discharge: Family;Available 24 hours/day Type of Home: House Home Access: Stairs to enter Entrance Stairs-Rails: Right Entrance Stairs-Number of Steps: 3 Home Layout: One level Home Equipment: Shower seat;Bedside commode;Walker - 2 wheels;Cane - single point;Grab bars - tub/shower;Other (comment) (oxygen ) Additional Comments: On 4L of oxygen at home     Prior Function Level of Independence: Independent with assistive device(s)  Comments: Reports he sometimes used the walker. Reports he is limited to 40 feet usually before he has to take a rest.      Hand Dominance   Dominant Hand: Right    Extremity/Trunk Assessment   Upper  Extremity Assessment Upper Extremity Assessment: Defer to OT evaluation    Lower Extremity Assessment Lower Extremity Assessment: Generalized weakness;RLE deficits/detail RLE Deficits / Details: Reports he broke his R foot a few months ago and it sometimes gives him trouble.     Cervical / Trunk Assessment Cervical / Trunk Assessment: Kyphotic  Communication   Communication: No difficulties  Cognition Arousal/Alertness: Awake/alert Behavior During Therapy: Flat affect Overall Cognitive Status: Within Functional Limits for tasks assessed                                        General Comments General comments (skin integrity, edema, etc.): Educated about energy conservation and activity pacing. Educated about HHPT recommendations and pt agreeable.     Exercises     Assessment/Plan    PT Assessment Patient needs continued PT services  PT Problem List Cardiopulmonary status limiting activity;Decreased knowledge of use of DME;Decreased mobility;Decreased balance;Decreased activity tolerance;Decreased strength       PT Treatment Interventions DME instruction;Gait training;Stair training;Therapeutic activities;Functional mobility training;Therapeutic exercise;Neuromuscular re-education;Balance training;Patient/family education    PT Goals (Current goals can be found in the Care Plan section)  Acute Rehab PT Goals Patient Stated Goal: to feel better  PT Goal Formulation: With patient Time For Goal Achievement: 06/08/17 Potential to Achieve Goals: Good    Frequency Min 3X/week   Barriers to discharge        Co-evaluation               AM-PAC PT "6 Clicks" Daily Activity  Outcome Measure Difficulty turning over in bed (including adjusting bedclothes, sheets and blankets)?: None Difficulty moving from lying on back to sitting on the side of the bed? : A Little Difficulty sitting down on and standing up from a chair with arms (e.g., wheelchair, bedside  commode, etc,.)?: Unable Help needed moving to and from a bed to chair (including a wheelchair)?: A Little Help needed walking in hospital room?: A Little Help needed climbing 3-5 steps with a railing? : A Lot 6 Click Score: 16    End of Session Equipment Utilized During Treatment: Gait belt Activity Tolerance: Treatment limited secondary to medical complications (Comment) (SOB, decreased oxygen sats ) Patient left: in bed;with call bell/phone within reach Nurse Communication: Mobility status PT Visit Diagnosis: Other abnormalities of gait and mobility (R26.89);Muscle weakness (generalized) (M62.81)    Time: 1308-6578 PT Time Calculation (min) (ACUTE ONLY): 23 min   Charges:   PT Evaluation $PT Eval Moderate Complexity: 1 Mod PT Treatments $Gait Training: 8-22 mins   PT G Codes:        Gladys Damme, PT, DPT  Acute Rehabilitation Services  Pager: [phone]   Lehman Prom 05/25/2017, 12:05 PM

## 2017-05-25 NOTE — Progress Notes (Signed)
PROGRESS NOTE    Joel Santos  ZOX:[id number] DOB: [birth year] DOA: 05/20/2017 PCP: Juliette Alcide, MD     Brief Narrative:  Joel Santos is a 71 year old male with medical history significant for chronic pulmonary fibrosis ([facility] [place]), remote history of CVA and cerebral sinus thrombosis on Coumadin, hypertension, and diabetes who presented to the ED as a transfer from [facility] for acute onset of hemoptysis for one day and was found to have new groundglass opacities concerning for pneumonia. CTA on admission was negative for PE but concerning for worsening mediastinal adenopathy in the setting of known pulmonary fibrosis in addition to scattered groundglass opacities. Since admission, patient's Coumadin has been on hold with decreased frequency of episodes of hemoptysis. Patient was empirically treated with azithromycin and ceftriaxone for CAP coverage. Pulmonology has been consulted.   Assessment & Plan:   Principal Problem:   Hemoptysis Active Problems:   Type 2 diabetes mellitus (HCC)   History of Coumadin therapy   Pulmonary fibrosis, unspecified (HCC)   Respiratory failure, acute-on-chronic (HCC)   Malnutrition of moderate degree   Acute on chronic hypoxic respiratory failure secondary to CAP as well as underlying ILD and pulmonary fibrosis  -CTA negative for PE but concerning for worsening mediastinal adenopathy in setting of known pulmonary fibrosis.  In addition to the scattered groundglass opacities. 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 Resume coumadin, heparin gtt to bridge    Chronic diastolic CHF with preserved ejection fraction, euvolemic -Stable   HLD -Continue lipitor   Depression -Continue effexor   Non-severe (moderate malnutrition), in context of chronic illness -Continue Ensure supplementation   DVT prophylaxis: heparin gtt and coumadin to resume Code Status: Full Family Communication: updated daughter over the phone 10/25 Disposition Plan: discharge to home with home health tomorrow if no further hemoptysis on heparin/coumadin   Consultants:   PCCM  Procedures:   None   Antimicrobials:  Anti-infectives    Start     Dose/Rate Route Frequency Ordered Stop   05/24/17 1400  cephALEXin (KEFLEX) capsule 500 mg     500 mg Oral Every 8 hours 05/24/17 1228     05/23/17 2000  azithromycin (ZITHROMAX) 500 mg in dextrose 5 % 250 mL IVPB  Status:  Discontinued     500 mg 250 mL/hr over 60 Minutes Intravenous Every 24 hours 05/23/17 1345 05/24/17 1229   05/23/17 1800  cefTRIAXone (ROCEPHIN) 1 g in dextrose 5 % 50 mL IVPB  Status:  Discontinued     1 g 100 mL/hr over 30 Minutes Intravenous Every 24 hours 05/23/17 1512 05/24/17 1228   05/23/17 1000  amoxicillin-clavulanate (AUGMENTIN) 875-125 MG per tablet 1 tablet  Status:  Discontinued     1 tablet Oral Every 12 hours 05/23/17 0933 05/23/17 1511   05/21/17 2000  azithromycin (ZITHROMAX) 500 mg in dextrose 5 % 250 mL IVPB  Status:  Discontinued     500 mg 250 mL/hr over 60 Minutes Intravenous Every 24 hours 05/21/17 0039 05/23/17 0933   05/21/17 1800  cefTRIAXone (ROCEPHIN) 1 g in dextrose 5 % 50 mL IVPB  Status:  Discontinued     1 g 100 mL/hr over  30 Minutes Intravenous Every 24 hours 05/21/17 0039 05/23/17 0933       Subjective: Patient feeling well today. He no hemoptysis since yesterday morning. Overall improved. Denies SOB, CP, N/V.   Objective: Vitals:   05/24/17 2159 05/25/17 0334 05/25/17 0409 05/25/17 0422  BP: (!) 100/52  107/67 106/61  Pulse:   65 69  Resp:   18 20   Temp:   98.1 F (36.7 C) 97.8 F (36.6 C)  TempSrc:   Oral Oral  SpO2:   100% 99%  Weight:  83 kg (182 lb 15.7 oz)    Height:        Intake/Output Summary (Last 24 hours) at 05/25/17 1309 Last data filed at 05/25/17 1111  Gross per 24 hour  Intake           559.96 ml  Output              800 ml  Net          -240.04 ml   Filed Weights   05/22/17 0615 05/23/17 0401 05/25/17 0334  Weight: 82 kg (180 lb 12.4 oz) 82.5 kg (181 lb 12.8 oz) 83 kg (182 lb 15.7 oz)    Examination:  General exam: Appears calm and comfortable  Respiratory system: On [place] O2, bilateral crackles, no resp distress, on 4L [place] O2  Cardiovascular system: S1 & S2 heard. No JVD, murmurs, rubs, gallops or clicks. No pedal edema. Gastrointestinal system: Abdomen is nondistended, soft and nontender. No organomegaly or masses felt. Normal bowel sounds heard. Central nervous system: Alert and oriented. No focal neurological deficits. Extremities: Symmetric 5 x 5 power. Skin: No rashes, lesions or ulcers Psychiatry: Judgement and insight appear normal. Mood & affect appropriate.   Data Reviewed: I have personally reviewed following labs and imaging studies  CBC:  Recent Labs Lab 05/21/17 0229 05/22/17 0738 05/24/17 0819 05/25/17 0453  WBC 11.0* 12.9* 7.9 5.9  HGB 12.5* 13.5 11.9* 10.7*  HCT 37.6* 41.1 37.3* 33.7*  MCV 88.7 90.5 90.8 90.3  PLT 207 221 218 209   Basic Metabolic Panel:  Recent Labs Lab 05/21/17 0229 05/24/17 0819 05/25/17 0453  NA 134* 139 139  K 3.2* 3.7 3.6  CL 99* 97* 99*  CO2 27 32 31  GLUCOSE 144* 108* 101*  BUN 11 11 12   CREATININE 0.88 0.82 0.72  CALCIUM 8.5* 8.8* 8.5*   GFR: Estimated Creatinine Clearance: 92.9 mL/min (by C-G formula based on SCr of 0.72 mg/dL). Liver Function Tests: No results for input(s): AST, ALT, ALKPHOS, BILITOT, PROT, ALBUMIN in the last 168 hours. No results for input(s): LIPASE, AMYLASE in the last 168 hours. No results for input(s): AMMONIA in  the last 168 hours. Coagulation Profile:  Recent Labs Lab 05/21/17 1308 05/24/17 0540 05/25/17 0453  INR 2.62 1.23 1.20   Cardiac Enzymes: No results for input(s): CKTOTAL, CKMB, CKMBINDEX, TROPONINI in the last 168 hours. BNP (last 3 results) No results for input(s): PROBNP in the last 8760 hours. HbA1C: No results for input(s): HGBA1C in the last 72 hours. CBG:  Recent Labs Lab 05/24/17 1219 05/24/17 1722 05/24/17 2034 05/25/17 0758 05/25/17 1207  GLUCAP 130* 141* 145* 90 124*   Lipid Profile: No results for input(s): CHOL, HDL, LDLCALC, TRIG, CHOLHDL, LDLDIRECT in the last 72 hours. Thyroid Function Tests: No results for input(s): TSH, T4TOTAL, FREET4, T3FREE, THYROIDAB in the last 72 hours. Anemia Panel: No results for input(s): VITAMINB12, FOLATE, FERRITIN, TIBC, IRON, RETICCTPCT in the last 72  hours. Sepsis Labs:  Recent Labs Lab 05/23/17 1105  PROCALCITON <0.10    Recent Results (from the past 240 hour(s))  Culture, blood (routine x 2) Call MD if unable to obtain prior to antibiotics being given     Status: None (Preliminary result)   Collection Time: 05/21/17  2:29 AM  Result Value Ref Range Status   Specimen Description BLOOD LEFT ARM  Final   Special Requests IN PEDIATRIC BOTTLE Blood Culture adequate volume  Final   Culture NO GROWTH 3 DAYS  Final   Report Status PENDING  Incomplete  Culture, blood (routine x 2) Call MD if unable to obtain prior to antibiotics being given     Status: None (Preliminary result)   Collection Time: 05/21/17  2:33 AM  Result Value Ref Range Status   Specimen Description BLOOD RIGHT ARM  Final   Special Requests IN PEDIATRIC BOTTLE Blood Culture adequate volume  Final   Culture NO GROWTH 3 DAYS  Final   Report Status PENDING  Incomplete  Culture, sputum-assessment     Status: None   Collection Time: 05/21/17 12:00 PM  Result Value Ref Range Status   Specimen Description SPUTUM  Final   Special Requests NONE  Final    Sputum evaluation THIS SPECIMEN IS ACCEPTABLE FOR SPUTUM CULTURE  Final   Report Status 05/21/2017 FINAL  Final  Culture, respiratory (NON-Expectorated)     Status: None   Collection Time: 05/21/17 12:00 PM  Result Value Ref Range Status   Specimen Description SPUTUM  Final   Special Requests NONE Reflexed from [id number]  Final   Gram Stain   Final    MODERATE WBC PRESENT, PREDOMINANTLY PMN FEW SQUAMOUS EPITHELIAL CELLS PRESENT RARE GRAM POSITIVE COCCI    Culture RARE STAPHYLOCOCCUS AUREUS  Final   Report Status 05/24/2017 FINAL  Final   Organism ID, Bacteria STAPHYLOCOCCUS AUREUS  Final      Susceptibility   Staphylococcus aureus - MIC*    CIPROFLOXACIN <=0.5 SENSITIVE Sensitive     ERYTHROMYCIN <=0.25 SENSITIVE Sensitive     GENTAMICIN <=0.5 SENSITIVE Sensitive     OXACILLIN 0.5 SENSITIVE Sensitive     TETRACYCLINE <=1 SENSITIVE Sensitive     VANCOMYCIN <=0.5 SENSITIVE Sensitive     TRIMETH/SULFA <=10 SENSITIVE Sensitive     CLINDAMYCIN <=0.25 SENSITIVE Sensitive     RIFAMPIN <=0.5 SENSITIVE Sensitive     Inducible Clindamycin NEGATIVE Sensitive     * RARE STAPHYLOCOCCUS AUREUS  Culture, expectorated sputum-assessment     Status: None   Collection Time: 05/21/17  1:00 PM  Result Value Ref Range Status   Specimen Description EXPECTORATED SPUTUM  Final   Special Requests NONE  Final   Sputum evaluation THIS SPECIMEN IS ACCEPTABLE FOR SPUTUM CULTURE  Final   Report Status 05/22/2017 FINAL  Final  Culture, respiratory (NON-Expectorated)     Status: None (Preliminary result)   Collection Time: 05/21/17  1:00 PM  Result Value Ref Range Status   Specimen Description EXPECTORATED SPUTUM  Final   Special Requests NONE Reflexed from [id number]  Final   Gram Stain   Final    MODERATE WBC PRESENT, PREDOMINANTLY PMN FEW SQUAMOUS EPITHELIAL CELLS PRESENT FEW GRAM POSITIVE COCCI IN CLUSTERS FEW GRAM NEGATIVE RODS    Culture CULTURE REINCUBATED FOR BETTER GROWTH  Final   Report Status  PENDING  Incomplete  Respiratory Panel by PCR     Status: None   Collection Time: 05/23/17 10:45 AM  Result Value Ref Range Status  Adenovirus NOT DETECTED NOT DETECTED Final   Coronavirus 229E NOT DETECTED NOT DETECTED Final   Coronavirus HKU1 NOT DETECTED NOT DETECTED Final   Coronavirus NL63 NOT DETECTED NOT DETECTED Final   Coronavirus OC43 NOT DETECTED NOT DETECTED Final   Metapneumovirus NOT DETECTED NOT DETECTED Final   Rhinovirus / Enterovirus NOT DETECTED NOT DETECTED Final   Influenza A NOT DETECTED NOT DETECTED Final   Influenza B NOT DETECTED NOT DETECTED Final   Parainfluenza Virus 1 NOT DETECTED NOT DETECTED Final   Parainfluenza Virus 2 NOT DETECTED NOT DETECTED Final   Parainfluenza Virus 3 NOT DETECTED NOT DETECTED Final   Parainfluenza Virus 4 NOT DETECTED NOT DETECTED Final   Respiratory Syncytial Virus NOT DETECTED NOT DETECTED Final   Bordetella pertussis NOT DETECTED NOT DETECTED Final   Chlamydophila pneumoniae NOT DETECTED NOT DETECTED Final   Mycoplasma pneumoniae NOT DETECTED NOT DETECTED Final       Radiology Studies: No results found.    Scheduled Meds: . atorvastatin  20 mg Oral Daily  . cephALEXin  500 mg Oral Q8H  . dextromethorphan-guaiFENesin  1 tablet Oral BID  . diazepam  5 mg Oral BID  . feeding supplement (ENSURE ENLIVE)  237 mL Oral BID BM  . gabapentin  800 mg Oral QID  . insulin aspart  0-9 Units Subcutaneous TID WC  . lubiprostone  24 mcg Oral BID WC  . venlafaxine XR  300 mg Oral Daily  . warfarin  7.5 mg Oral ONCE-1800  . Warfarin - Pharmacist Dosing Inpatient   Does not apply q1800   Continuous Infusions: . heparin 1,550 Units/hr (05/25/17 1129)     LOS: 5 days    Time spent: 30 minutes   Noralee Stain, DO [facility] [email or web address] Password [email or web address] 05/25/2017, 1:09 PM

## 2017-05-25 NOTE — Evaluation (Signed)
Occupational Therapy Evaluation Patient Details Name: Joel SitesJackie Santos MRN: [id number] DOB: Apr 28, 1946 Today's Date: 05/25/2017    History of Present Illness Pt is a 71 y/o male admitted secondary to hemoptysis and CAP. Chest imaging showed ground glass opacities and interstitial lung disease. PMH includes pulmonary fibrosis on 4L of oxygen at home, CVA, dCHF, DM, HTN, and DVT.    Clinical Impression   PTA Pt independent in ADL and mobility with RW. Pt has waxed and wained in necessary assist with ADL as he combats his lung disease. Pt currently at min guard for ADL and functional transfers. Pt has appropriate DME and 24 hour supervision and assist from wife. Pt educated on energy conservation and provided handout - reviewed in full. Pt with no questions or concerns at the end of the session. Feel Pt is close to baseline, with main concern being mobility - which is addressed by PT; Pt with very good safety awareness. OT to sign off at this time. Thank you for the opportunity to serve this patient.      Follow Up Recommendations  Supervision/Assistance - 24 hour    Equipment Recommendations  None recommended by OT (Pt has appropriate DME)    Recommendations for Other Services PT consult     Precautions / Restrictions Precautions Precautions: Fall Restrictions Weight Bearing Restrictions: No      Mobility Bed Mobility Overal bed mobility: Modified Independent             General bed mobility comments: Increased time and use of elevated HOB and bed rails. No external assist required.   Transfers Overall transfer level: Needs assistance Equipment used: Rolling walker (2 wheeled) Transfers: Sit to/from Stand Sit to Stand: Min guard         General transfer comment: Min guard for safety. Increased time and effort required. vc for hand placement    Balance Overall balance assessment: Needs assistance Sitting-balance support: No upper extremity supported;Feet  supported Sitting balance-Leahy Scale: Good     Standing balance support: Bilateral upper extremity supported;During functional activity Standing balance-Leahy Scale: Poor Standing balance comment: Reliant on UE support for balance.                            ADL either performed or assessed with clinical judgement   ADL Overall ADL's : Needs assistance/impaired Eating/Feeding: Modified independent   Grooming: Min guard;Wash/dry hands;Wash/dry face;Standing Grooming Details (indicate cue type and reason): sink level Upper Body Bathing: Min guard   Lower Body Bathing: Min guard;Sitting/lateral leans   Upper Body Dressing : Set up   Lower Body Dressing: Min guard;Sit to/from stand Lower Body Dressing Details (indicate cue type and reason): able to doff/don socks min guard for safety Toilet Transfer: Min guard;Ambulation;RW;Comfort height toilet;Grab bars   Toileting- Clothing Manipulation and Hygiene: Min guard       Functional mobility during ADLs: Min guard;Rolling walker General ADL Comments: Pt educated on energy conservation, provided handout and reviewed in full.      Vision Patient Visual Report: No change from baseline       Perception     Praxis      Pertinent Vitals/Pain Pain Assessment: 0-10 Pain Score: 2  Pain Location: headache Pain Descriptors / Indicators: Headache Pain Intervention(s): Monitored during session (RN notified; encouraged hydration)     Hand Dominance Right   Extremity/Trunk Assessment Upper Extremity Assessment Upper Extremity Assessment: Generalized weakness   Lower Extremity Assessment Lower Extremity Assessment:  Defer to PT evaluation RLE Deficits / Details: Reports he broke his R foot a few months ago and it sometimes gives him trouble.    Cervical / Trunk Assessment Cervical / Trunk Assessment: Kyphotic   Communication Communication Communication: No difficulties   Cognition Arousal/Alertness:  Awake/alert Behavior During Therapy: Flat affect Overall Cognitive Status: Within Functional Limits for tasks assessed                                     General Comments  Educated about energy conservation and activity pacing. Educated about HHPT recommendations and pt agreeable.     Exercises     Shoulder Instructions      Home Living Family/patient expects to be discharged to:: Private residence Living Arrangements: Spouse/significant other Available Help at Discharge: Family;Available 24 hours/day Type of Home: House Home Access: Stairs to enter [employer] of Steps: 3 Entrance Stairs-Rails: Right Home Layout: One level     Bathroom Shower/Tub: Producer, television/film/video: Standard Bathroom Accessibility: Yes How Accessible: Accessible via walker Home Equipment: Shower seat;Bedside commode;Walker - 2 wheels;Cane - single point;Grab bars - tub/shower;Other (comment) (oxygen )   Additional Comments: On 4L of oxygen at home       Prior Functioning/Environment Level of Independence: Independent with assistive device(s)        Comments: Reports he sometimes used the walker. Reports he is limited to 40 feet usually before he has to take a rest.         OT Problem List: Decreased activity tolerance      OT Treatment/Interventions:      OT Goals(Current goals can be found in the care plan section) Acute Rehab OT Goals Patient Stated Goal: to feel better  OT Goal Formulation: With patient Time For Goal Achievement: 06/08/17 Potential to Achieve Goals: Good  OT Frequency:     Barriers to D/C:            Co-evaluation              AM-PAC PT "6 Clicks" Daily Activity     Outcome Measure Help from another person eating meals?: None Help from another person taking care of personal grooming?: None Help from another person toileting, which includes using toliet, bedpan, or urinal?: A Little Help from another person bathing  (including washing, rinsing, drying)?: A Little Help from another person to put on and taking off regular upper body clothing?: None Help from another person to put on and taking off regular lower body clothing?: A Little 6 Click Score: 21   End of Session Equipment Utilized During Treatment: Gait belt;Rolling walker;Oxygen Nurse Communication: Mobility status  Activity Tolerance: Patient tolerated treatment well Patient left: in bed;with call bell/phone within reach;with bed alarm set;with nursing/sitter in room  OT Visit Diagnosis: Unsteadiness on feet (R26.81);Muscle weakness (generalized) (M62.81)                Time: 1410-1430 OT Time Calculation (min): 20 min Charges:  OT General Charges $OT Visit: 1 Visit OT Evaluation $OT Eval Moderate Complexity: 1 Mod G-Codes:     Sherryl Manges OTR/L [phone]  Evern Bio Ilaria Much 05/25/2017, 2:35 PM

## 2017-05-25 NOTE — Progress Notes (Signed)
ANTICOAGULATION CONSULT NOTE - Follow-Up Consult  Pharmacy Consult for Heparin > Coumadin Indication: hx CVA and cerebral sinus thrombosis  No Known Allergies  Patient Measurements: Height: 6\' 1"  (185.4 cm) Weight: 182 lb 15.7 oz (83 kg) IBW/kg (Calculated) : 79.9 Heparin Dosing Weight: 83 kg  Vital Signs: Temp: 97.8 F (36.6 C) (10/25 0422) Temp Source: Oral (10/25 0422) BP: 106/61 (10/25 0422) Pulse Rate: 69 (10/25 0422)  Labs:  Recent Labs  05/24/17 0540 05/24/17 0819 05/24/17 2140 05/25/17 0453 05/25/17 0830  HGB  --  11.9*  --  10.7*  --   HCT  --  37.3*  --  33.7*  --   PLT  --  218  --  209  --   LABPROT 15.4*  --   --  15.1  --   INR 1.23  --   --  1.20  --   HEPARINUNFRC  --   --  <0.10*  --  <0.10*  CREATININE  --  0.82  --  0.72  --     Estimated Creatinine Clearance: 92.9 mL/min (by C-G formula based on SCr of 0.72 mg/dL).   Medical History: Past Medical History:  Diagnosis Date  . Abnormal myocardial perfusion study    Possible lateral ischemia March 2013  . Anxiety   . Cerebral venous thrombosis    MR venogram with patent dural sinuses 2009  . Chronic pain   . History of hematuria    Cystoscopy - follows with Urology  . Mixed hyperlipidemia   . Stroke [facility])    Recurrent by report - although head MRI negative 2009  . Type 2 diabetes mellitus (HCC)     Medications:  Prescriptions Prior to Admission  Medication Sig Dispense Refill Last Dose  . atorvastatin (LIPITOR) 20 MG tablet Take 20 mg by mouth daily.   05/19/2017  . diazepam (VALIUM) 5 MG tablet Take 5 mg by mouth 2 (two) times daily.   05/19/2017  . gabapentin (NEURONTIN) 800 MG tablet Take 800 mg by mouth 4 (four) times daily.   05/19/2017  . lisinopril (PRINIVIL,ZESTRIL) 5 MG tablet Take 5 mg by mouth daily.  3 05/19/2017  . lubiprostone (AMITIZA) 24 MCG capsule Take 1 capsule (24 mcg total) by mouth 2 (two) times daily with a meal. 42 capsule 0 05/19/2017  . nitroGLYCERIN  (NITROSTAT) 0.4 MG SL tablet Place 1 tablet (0.4 mg total) under the tongue every 5 (five) minutes as needed for chest pain. 25 tablet 3 unknown at prn  . oxyCODONE-acetaminophen (PERCOCET) 5-325 MG per tablet Take 1 tablet by mouth every 6 (six) hours as needed.   05/19/2017 at prn  . polyethylene glycol (MIRALAX / GLYCOLAX) packet Take 17 g by mouth daily.   05/19/2017 at Unknown time  . venlafaxine XR (EFFEXOR-XR) 150 MG 24 hr capsule Take 300 mg by mouth daily.   05/19/2017  . warfarin (COUMADIN) 6 MG tablet Take 6 mg by mouth as directed.    05/19/2017  . [DISCONTINUED] lisinopril (PRINIVIL,ZESTRIL) 5 MG tablet Take 1 tablet (5 mg total) by mouth daily. 90 tablet 3   . [DISCONTINUED] mirtazapine (REMERON) 30 MG tablet Take 15 mg by mouth at bedtime.   Not Taking    Assessment: 71 yo M on Coumadin PTA for hx of CVA and cerebral sinus thrombosis.  Pt initially presented to [facility] and was transferred to [facility] for acute onset of hemoptysis.  Anticoagulation has been held since admission.    Hemoptysis resolving.  Pharmacy was asked to resume Coumadin with heparin bridging on 10/24.  INR today 1.2  Heparin level is subtherapeutic on 1350 units/hr.  Goal of Therapy:  INR 2-3 Heparin level 0.3-0.7 units/ml Monitor platelets by anticoagulation protocol: Yes   Plan:  Increase heparin to 1550 units/hr Heparin level in 6 hours Repeat Coumadin 7.5 mg PO x 1 tonight Heparin level, INR, and CBC daily  Toys 'R' UsKimberly Caitlain Tweed, Pharm.D., BCPS Clinical Pharmacist Pager: [phone] Clinical phone for 05/25/2017 from 8:30-4:00 is [phone]. After 4pm, please call Main Rx [phone]) for assistance. 05/25/2017 11:26 AM

## 2017-05-25 NOTE — Progress Notes (Signed)
ANTICOAGULATION CONSULT NOTE - Follow-Up Consult  Pharmacy Consult for Heparin > Coumadin Indication: hx CVA and cerebral sinus thrombosis  No Known Allergies  Patient Measurements: Height: 6\' 1"  (185.4 cm) Weight: 182 lb 15.7 oz (83 kg) IBW/kg (Calculated) : 79.9 Heparin Dosing Weight: 83 kg  Vital Signs: Temp: 97.5 F (36.4 C) (10/25 1421) Temp Source: Oral (10/25 1421) BP: 104/60 (10/25 1421) Pulse Rate: 91 (10/25 1421)  Labs:  Recent Labs  05/24/17 0540 05/24/17 0819 05/24/17 2140 05/25/17 0453 05/25/17 0830 05/25/17 1731  HGB  --  11.9*  --  10.7*  --   --   HCT  --  37.3*  --  33.7*  --   --   PLT  --  218  --  209  --   --   LABPROT 15.4*  --   --  15.1  --   --   INR 1.23  --   --  1.20  --   --   HEPARINUNFRC  --   --  <0.10*  --  <0.10* <0.10*  CREATININE  --  0.82  --  0.72  --   --     Estimated Creatinine Clearance: 92.9 mL/min (by C-G formula based on SCr of 0.72 mg/dL).   Medical History: Past Medical History:  Diagnosis Date  . Abnormal myocardial perfusion study    Possible lateral ischemia March 2013  . Anxiety   . Cerebral venous thrombosis    MR venogram with patent dural sinuses 2009  . Chronic pain   . History of hematuria    Cystoscopy - follows with Urology  . Mixed hyperlipidemia   . Stroke [facility])    Recurrent by report - although head MRI negative 2009  . Type 2 diabetes mellitus (HCC)     Medications:  Prescriptions Prior to Admission  Medication Sig Dispense Refill Last Dose  . atorvastatin (LIPITOR) 20 MG tablet Take 20 mg by mouth daily.   05/19/2017  . diazepam (VALIUM) 5 MG tablet Take 5 mg by mouth 2 (two) times daily.   05/19/2017  . gabapentin (NEURONTIN) 800 MG tablet Take 800 mg by mouth 4 (four) times daily.   05/19/2017  . lisinopril (PRINIVIL,ZESTRIL) 5 MG tablet Take 5 mg by mouth daily.  3 05/19/2017  . lubiprostone (AMITIZA) 24 MCG capsule Take 1 capsule (24 mcg total) by mouth 2 (two) times daily with a meal.  42 capsule 0 05/19/2017  . nitroGLYCERIN (NITROSTAT) 0.4 MG SL tablet Place 1 tablet (0.4 mg total) under the tongue every 5 (five) minutes as needed for chest pain. 25 tablet 3 unknown at prn  . oxyCODONE-acetaminophen (PERCOCET) 5-325 MG per tablet Take 1 tablet by mouth every 6 (six) hours as needed.   05/19/2017 at prn  . polyethylene glycol (MIRALAX / GLYCOLAX) packet Take 17 g by mouth daily.   05/19/2017 at Unknown time  . venlafaxine XR (EFFEXOR-XR) 150 MG 24 hr capsule Take 300 mg by mouth daily.   05/19/2017  . warfarin (COUMADIN) 6 MG tablet Take 6 mg by mouth as directed.    05/19/2017  . [DISCONTINUED] lisinopril (PRINIVIL,ZESTRIL) 5 MG tablet Take 1 tablet (5 mg total) by mouth daily. 90 tablet 3   . [DISCONTINUED] mirtazapine (REMERON) 30 MG tablet Take 15 mg by mouth at bedtime.   Not Taking    Assessment: 71 yo M on Coumadin PTA for hx of CVA and cerebral sinus thrombosis.  Pt [facility] and [facility].  Anticoagulation has been held since admission.    Hemoptysis resolving.  Pharmacy was asked to resume Coumadin with heparin bridging on 10/24.  INR today 1.2 - warfarin 7.5mg  given. Heparin level < 0.1  is subtherapeutic on 1550 units/hr. Discussed and evaluated IV site at bedside with nurse - no problems   Goal of Therapy:  INR 2-3 Heparin level 0.3-0.7 units/ml Monitor platelets by anticoagulation protocol: Yes   Plan:  Increase heparin to 1800 units/hr Heparin level, INR, and CBC daily  Leota SauersLisa Emelin Dascenzo Pharm.D. CPP, BCPS Clinical Pharmacist [phone] 05/25/2017 7:50 PM

## 2017-05-25 NOTE — Progress Notes (Signed)
Nutrition Follow-up  DOCUMENTATION CODES:   Non-severe (moderate) malnutrition in context of chronic illness  INTERVENTION:   -Continue Ensure Enlive po BID, each supplement provides 350 kcal and 20 grams of protein  NUTRITION DIAGNOSIS:   Malnutrition (Moderate) related to chronic illness (pulmonary fibrosis) as evidenced by energy intake < 75% for > or equal to 1 month, mild depletion of body fat, moderate depletion of body fat, mild depletion of muscle mass, moderate depletions of muscle mass.  Ongoing  GOAL:   Patient will meet greater than or equal to 90% of their needs  Progressing  MONITOR:   PO intake, Supplement acceptance, Labs, Weight trends, Skin, I & O's  REASON FOR ASSESSMENT:   Malnutrition Screening Tool    ASSESSMENT:   71yoM with history of Pulmonary fibrosis [facility]), CVA (2009), Cerebral venous thrombosis (since 2009, on chronic coumadin), HTN, DM, and Diastolic CHF (on TTE 11/2015), admitted with hemoptysis and productive (white thick) sputum x 1 day.    Pt sleeping soundly at time of visit. RD did not disturb.   Pt's appetite continues to be good. Noted meal completion 85-100%. Pt is also taking Ensure supplement per MAR.   Per MD notes, potential discharge to home soon, given results of therapy evaluations.  Labs reviewed: CBGS: 90-145 (inpatient orders for glycemic control are 0-9 units of insulin aspart TID witht meals).  Diet Order:  Diet Heart Room service appropriate? Yes; Fluid consistency: Thin  Skin:  Reviewed, no issues  Last BM:  05/24/17  Height:   Ht Readings from Last 1 Encounters:  05/21/17 6\' 1"  (1.854 m)    Weight:   Wt Readings from Last 1 Encounters:  05/25/17 182 lb 15.7 oz (83 kg)    Ideal Body Weight:  83.6 kg  BMI:  Body mass index is 24.14 kg/m.  Estimated Nutritional Needs:   Kcal:  2200-2400  Protein:  110-125 grams  Fluid:  2.2-2.4 L  EDUCATION NEEDS:    Education needs addressed  Edison Nicholson A. Mayford KnifeWilliams, RD, LDN, CDE Pager: [phone] After hours Pager: [phone]

## 2017-05-25 NOTE — Discharge Instructions (Signed)

## 2017-05-26 LAB — CULTURE, BLOOD (ROUTINE X 2)
CULTURE: NO GROWTH
CULTURE: NO GROWTH
SPECIAL REQUESTS: ADEQUATE
Special Requests: ADEQUATE

## 2017-05-26 LAB — PROTIME-INR
INR: 1.28
PROTHROMBIN TIME: 15.9 s — AB (ref 11.4–15.2)

## 2017-05-26 LAB — CBC
HEMATOCRIT: 34 % — AB (ref 39.0–52.0)
HEMOGLOBIN: 10.8 g/dL — AB (ref 13.0–17.0)
MCH: 28.8 pg (ref 26.0–34.0)
MCHC: 31.8 g/dL (ref 30.0–36.0)
MCV: 90.7 fL (ref 78.0–100.0)
Platelets: 225 10*3/uL (ref 150–400)
RBC: 3.75 MIL/uL — ABNORMAL LOW (ref 4.22–5.81)
RDW: 13.1 % (ref 11.5–15.5)
WBC: 7.4 10*3/uL (ref 4.0–10.5)

## 2017-05-26 LAB — HEPARIN LEVEL (UNFRACTIONATED): HEPARIN UNFRACTIONATED: 0.15 [IU]/mL — AB (ref 0.30–0.70)

## 2017-05-26 LAB — GLUCOSE, CAPILLARY
Glucose-Capillary: 105 mg/dL — ABNORMAL HIGH (ref 65–99)
Glucose-Capillary: 167 mg/dL — ABNORMAL HIGH (ref 65–99)

## 2017-05-26 MED ORDER — ENOXAPARIN SODIUM 120 MG/0.8ML ~~LOC~~ SOLN: 120.0000 mg | SUBCUTANEOUS | 0 refills | Status: AC

## 2017-05-26 MED ORDER — ENOXAPARIN SODIUM 120 MG/0.8ML ~~LOC~~ SOLN
120.0000 mg | SUBCUTANEOUS | Status: DC
Start: 2017-05-26 — End: 2017-05-26
  Administered 2017-05-26: 120 mg via SUBCUTANEOUS
  Filled 2017-05-26: qty 0.8

## 2017-05-26 MED ORDER — ENOXAPARIN (LOVENOX) PATIENT EDUCATION KIT
PACK | Freq: Once | Status: DC
  Filled 2017-05-26: qty 1

## 2017-05-26 MED ORDER — WARFARIN SODIUM 6 MG PO TABS: ORAL_TABLET | ORAL | 0 refills | Status: AC

## 2017-05-26 MED ORDER — CEPHALEXIN 500 MG PO CAPS
500.0000 mg | ORAL_CAPSULE | Freq: Three times a day (TID) | ORAL | 0 refills | Status: AC
Start: 2017-05-26 — End: 2017-05-28

## 2017-05-26 MED ORDER — WARFARIN SODIUM 7.5 MG PO TABS: 7.5000 mg | ORAL_TABLET | Freq: Once | ORAL | Status: DC

## 2017-05-26 NOTE — Progress Notes (Signed)
Joel SitesJackie Santos to be D/C'd Home per MD order.  Discussed with the patient and all questions fully answered.  VSS, Skin clean, dry and intact without evidence of skin break down, no evidence of skin tears noted. IV catheter discontinued intact. Site without signs and symptoms of complications. Dressing and pressure applied.  An After Visit Summary was printed and given to the patient. Patient received prescription.  D/c education completed with patient/family including follow up instructions, medication list, d/c activities limitations if indicated, with other d/c instructions as indicated by MD - patient able to verbalize understanding, all questions fully answered.   Patient instructed to return to ED, call 911, or call MD for any changes in condition.  Pt demonstrated how to give Lovenox short. Detail instruction was also given to pt to take 9 mg on 26th and 27th and can resume to 6 mg on the 28th.  Patient escorted via WC, and D/C home via private auto.  Joel Santos 05/26/2017 6:49 PM

## 2017-05-26 NOTE — Progress Notes (Signed)
ANTICOAGULATION CONSULT NOTE  Pharmacy Consult for Heparin and Coumadin Indication: hx CVA and cerebral sinus thrombosis  No Known Allergies  Patient Measurements: Height: 6\' 1"  (185.4 cm) Weight: 182 lb 3.2 oz (82.6 kg) IBW/kg (Calculated) : 79.9 Heparin Dosing Weight: 83 kg  Vital Signs: Temp: 97.4 F (36.3 C) (10/26 0500) Temp Source: Oral (10/26 0500) BP: 114/62 (10/26 0500) Pulse Rate: 80 (10/26 0500)  Labs:  Recent Labs  05/24/17 0540  05/24/17 0819  05/25/17 0453 05/25/17 0830 05/25/17 1731 05/26/17 0649  HGB  --   < > 11.9*  --  10.7*  --   --  10.8*  HCT  --   --  37.3*  --  33.7*  --   --  34.0*  PLT  --   --  218  --  209  --   --  225  LABPROT 15.4*  --   --   --  15.1  --   --  15.9*  INR 1.23  --   --   --  1.20  --   --  1.28  HEPARINUNFRC  --   --   --   < >  --  <0.10* <0.10* 0.15*  CREATININE  --   --  0.82  --  0.72  --   --   --   < > = values in this interval not displayed.  Estimated Creatinine Clearance: 92.9 mL/min (by C-G formula based on SCr of 0.72 mg/dL).   Assessment: 71 yo Male with hx of CVA and cerebral sinus thrombosis, INR subtherapeutic, for heparin   Goal of Therapy:  INR 2-3 Heparin level 0.3-0.7 units/ml Monitor platelets by anticoagulation protocol: Yes   Plan:  Increase Heparin 2100 units/hr Check heparin level in 8 hours.  Coumadin 7.5 mg today  Geannie RisenGreg Lelia Jons, PharmD, BCPS  05/26/2017 8:04 AM

## 2017-05-26 NOTE — Discharge Summary (Signed)
Physician Discharge Summary  Joel Santos ZOX:[id number] DOB: Jul 27, 1946 DOA: 05/20/2017  PCP: Juliette Alcide, MD  Admit date: 05/20/2017 Discharge date: 05/26/2017  Admitted From: Home Disposition:  Home  Recommendations for Outpatient Follow-up:  1. Follow up with PCP within 1 week 2. Need INR check on 10/29. Bridge with lovenox until follow up.  3. Follow up with Pulmonology on 10/29  Home Health: PT OT RN aide  Equipment/Devices: None   Discharge Condition: Stable CODE STATUS: Full  Diet recommendation: Heart healthy   Brief/Interim Summary: Joel Santos is a 71 year old male with medical history significant for chronic pulmonary [place] remote history of CVA and cerebral sinus thrombosis on Coumadin, hypertension, and diabetes who presented to the ED as a transfer from [facility] for acute onset of hemoptysis for one day and was found to have new groundglass opacities concerning for pneumonia.CTA on admission was negative for PE but concerning for worsening mediastinal adenopathy in the setting of known pulmonary fibrosis in addition to scattered groundglass opacities. Since admission, patient's Coumadin has been on hold with decreased frequency of episodes of hemoptysis.Patient was empirically treated with azithromycin and ceftriaxone for CAP coverage. Pulmonology has been consulted. Workup was consistent with MSSA community acquired pneumonia. Patient's antibiotic was transitioned to oral Keflex. He is to follow-up with pulmonology as outpatient. Patient's Coumadin as well as heparin drip were restarted. He had no episodes of hemoptysis for 2 days prior to discharge. He is discharged with Coumadin at an increased dose for 2 days, then to resume his regular daily dosing as well as Lovenox to bridge. He is instructed to follow-up with his primary care physician for INR check on Monday 10/29. He voiced understanding.  Discharge Diagnoses:   Principal Problem:   Hemoptysis Active Problems:   Type 2 diabetes mellitus (HCC)   History of Coumadin therapy   Pulmonary fibrosis, unspecified (HCC)   Respiratory failure, acute-on-chronic (HCC)   Malnutrition of moderate degree   Acute on chronic hypoxic respiratory failure secondary to CAP as well as underlying ILD and pulmonary fibrosis  -CTA negative for PE but concerning for worsening mediastinal adenopathy in setting of known pulmonary fibrosis. In addition to the scattered groundglass opacities. 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 No episodes for 2 days on heparin/coumadin. Spoke with pharmacy, patient to take coumadin 9mg  for 2 days, then resume home dose 6mg  daily. He will also be discharged with lovenox to bridge.   Chronic diastolic CHF with preserved ejection fraction, euvolemic -Stable   HLD -Continue lipitor   Depression -Continue effexor   Non-severe (moderate malnutrition), in context of chronic illness -Continue Ensure supplementation  Discharge Instructions  Discharge Instructions    Call MD for:  difficulty breathing, headache or visual disturbances    Complete by:  As directed    Call MD for:  extreme fatigue    Complete by:  As directed    Call MD for:  hives    Complete by:  As directed    Call MD for:  persistant dizziness or light-headedness    Complete by:  As directed    Call MD for:  persistant nausea and  vomiting    Complete by:  As directed    Call MD for:  severe uncontrolled pain    Complete by:  As directed    Call MD for:  temperature >100.4    Complete by:  As  directed    Diet - low sodium heart healthy    Complete by:  As directed    Discharge instructions    Complete by:  As directed    You were cared for by a hospitalist during your hospital stay. If you have any questions about your discharge medications or the care you received while you were in the hospital after you are discharged, you can call the unit and asked to speak with the hospitalist on call if the hospitalist that took care of you is not available. Once you are discharged, your primary care physician will handle any further medical issues. Please note that NO REFILLS for any discharge medications will be authorized once you are discharged, as it is imperative that you return to your primary care physician (or establish a relationship with a primary care physician if you do not have one) for your aftercare needs so that they can reassess your need for medications and monitor your lab values.   Increase activity slowly    Complete by:  As directed      Allergies as of 05/26/2017   No Known Allergies     Medication List    TAKE these medications   atorvastatin 20 MG tablet Commonly known as:  LIPITOR Take 20 mg by mouth daily.   cephALEXin 500 MG capsule Commonly known as:  KEFLEX Take 1 capsule (500 mg total) by mouth every 8 (eight) hours.   diazepam 5 MG tablet Commonly known as:  VALIUM Take 5 mg by mouth 2 (two) times daily.   enoxaparin 120 MG/0.8ML injection Commonly known as:  LOVENOX Inject 0.8 mLs (120 mg total) into the skin daily.   gabapentin 800 MG tablet Commonly known as:  NEURONTIN Take 800 mg by mouth 4 (four) times daily.   lisinopril 5 MG tablet Commonly known as:  PRINIVIL,ZESTRIL Take 5 mg by mouth daily.   lubiprostone 24 MCG capsule Commonly known as:  AMITIZA Take 1 capsule (24 mcg total) by mouth 2 (two) times daily with a meal.   nitroGLYCERIN 0.4 MG SL tablet Commonly known as:  NITROSTAT Place 1 tablet (0.4 mg total) under the  tongue every 5 (five) minutes as needed for chest pain.   oxyCODONE-acetaminophen 5-325 MG tablet Commonly known as:  PERCOCET/ROXICET Take 1 tablet by mouth every 6 (six) hours as needed.   polyethylene glycol packet Commonly known as:  MIRALAX / GLYCOLAX Take 17 g by mouth daily.   venlafaxine XR 150 MG 24 hr capsule Commonly known as:  EFFEXOR-XR Take 300 mg by mouth daily.   warfarin 6 MG tablet Commonly known as:  COUMADIN 9mg  (1.5 tablet) on 10/26, 10/27, then resume your usual dose 6mg  (1 tablet) on 10/28. Follow up with PCP regarding further dosing changes. What changed:  how much to take  how to take this  when to take this  additional instructions      Follow-up Information    Burdine, Ananias Pilgrim, MD. Schedule an appointment as soon as possible for a visit in 1 week(s).   Specialty:  Family Medicine Why:  you must follow up on 10/29 for coumadin/INR check  Contact information: [address] [place] [address] [phone]  Chilton Greathouse, MD. Nyra Capes on 05/31/2017.   Specialty:  Pulmonary Disease Contact information: [address] [place] [address] [phone]          No Known Allergies  Consultations:  PCCM    Procedures/Studies: Dg Chest 2 View  Result Date: 05/23/2017 CLINICAL DATA:  Hemoptysis.  Pulmonary fibrosis. EXAM: CHEST  2 VIEW COMPARISON:  No prior . FINDINGS: Mediastinum and hilar structures are normal. Cardiomegaly. No pulmonary venous congestion . Diffuse interstitial prominence is noted bilaterally most consistent chronic interstitial lung disease given patient's history. Overlying active interstitial process cannot be excluded. No pleural effusion or pneumothorax. IMPRESSION: 1. Diffuse bilateral pulmonary interstitial prominence most likely chronic interstitial lung disease given the patient's history. Overlying active interstitial process cannot be excluded. 2. Cardiomegaly.  No pulmonary venous congestion.  Electronically Signed   By: Maisie Fus  Register   On: 05/23/2017 09:03      Discharge Exam: Vitals:   05/26/17 0543 05/26/17 1352  BP: 114/62 116/66  Pulse: 80 85  Resp: 19 18  Temp: (!) 97.4 F (36.3 C) (!) 97.4 F (36.3 C)  SpO2: 99% 100%   Vitals:   05/25/17 2041 05/26/17 0500 05/26/17 0543 05/26/17 1352  BP: (!) 146/81 114/62 114/62 116/66  Pulse: 85 80 80 85  Resp: 17 19 19 18   Temp: 97.8 F (36.6 C) (!) 97.4 F (36.3 C) (!) 97.4 F (36.3 C) (!) 97.4 F (36.3 C)  TempSrc: Oral Oral Oral Oral  SpO2: 98% 99% 99% 100%  Weight:  82.6 kg (182 lb 3.2 oz)    Height:         General: Pt is alert, awake, not in acute distress Cardiovascular: RRR, S1/S2 +, no rubs, no gallops Respiratory: +bibasilar crackles, no respiratory distress, on 4L [place] O2 Abdominal: Soft, NT, ND, bowel sounds + Extremities: no edema, no cyanosis    The results of significant diagnostics from this hospitalization (including imaging, microbiology, ancillary and laboratory) are listed below for reference.     Microbiology: Recent Results (from the past 240 hour(s))  Culture, blood (routine x 2) Call MD if unable to obtain prior to antibiotics being given     Status: None   Collection Time: 05/21/17  2:29 AM  Result Value Ref Range Status   Specimen Description BLOOD LEFT ARM  Final   Special Requests IN PEDIATRIC BOTTLE Blood Culture adequate volume  Final   Culture NO GROWTH 5 DAYS  Final   Report Status 05/26/2017 FINAL  Final  Culture, blood (routine x 2) Call MD if unable to obtain prior to antibiotics being given     Status: None   Collection Time: 05/21/17  2:33 AM  Result Value Ref Range Status   Specimen Description BLOOD RIGHT ARM  Final   Special Requests IN PEDIATRIC BOTTLE Blood Culture adequate volume  Final   Culture NO GROWTH 5 DAYS  Final   Report Status 05/26/2017 FINAL  Final  Culture, sputum-assessment     Status: None   Collection Time: 05/21/17 12:00 PM  Result Value Ref  Range Status   Specimen Description SPUTUM  Final   Special Requests NONE  Final   Sputum evaluation THIS SPECIMEN IS ACCEPTABLE FOR SPUTUM CULTURE  Final   Report Status 05/21/2017 FINAL  Final  Culture, respiratory (NON-Expectorated)     Status: None   Collection Time: 05/21/17 12:00 PM  Result Value Ref Range Status   Specimen Description SPUTUM  Final   Special Requests NONE  Reflexed from [id number]  Final   Gram Stain   Final    MODERATE WBC PRESENT, PREDOMINANTLY PMN FEW SQUAMOUS EPITHELIAL CELLS PRESENT RARE GRAM POSITIVE COCCI    Culture RARE STAPHYLOCOCCUS AUREUS  Final   Report Status 05/24/2017 FINAL  Final   Organism ID, Bacteria STAPHYLOCOCCUS AUREUS  Final      Susceptibility   Staphylococcus aureus - MIC*    CIPROFLOXACIN <=0.5 SENSITIVE Sensitive     ERYTHROMYCIN <=0.25 SENSITIVE Sensitive     GENTAMICIN <=0.5 SENSITIVE Sensitive     OXACILLIN 0.5 SENSITIVE Sensitive     TETRACYCLINE <=1 SENSITIVE Sensitive     VANCOMYCIN <=0.5 SENSITIVE Sensitive     TRIMETH/SULFA <=10 SENSITIVE Sensitive     CLINDAMYCIN <=0.25 SENSITIVE Sensitive     RIFAMPIN <=0.5 SENSITIVE Sensitive     Inducible Clindamycin NEGATIVE Sensitive     * RARE STAPHYLOCOCCUS AUREUS  Culture, expectorated sputum-assessment     Status: None   Collection Time: 05/21/17  1:00 PM  Result Value Ref Range Status   Specimen Description EXPECTORATED SPUTUM  Final   Special Requests NONE  Final   Sputum evaluation THIS SPECIMEN IS ACCEPTABLE FOR SPUTUM CULTURE  Final   Report Status 05/22/2017 FINAL  Final  Culture, respiratory (NON-Expectorated)     Status: None   Collection Time: 05/21/17  1:00 PM  Result Value Ref Range Status   Specimen Description EXPECTORATED SPUTUM  Final   Special Requests NONE Reflexed from [id number]  Final   Gram Stain   Final    MODERATE WBC PRESENT, PREDOMINANTLY PMN FEW SQUAMOUS EPITHELIAL CELLS PRESENT FEW GRAM POSITIVE COCCI IN CLUSTERS FEW GRAM NEGATIVE RODS    Culture  MODERATE Consistent with normal respiratory flora.  Final   Report Status 05/25/2017 FINAL  Final  Respiratory Panel by PCR     Status: None   Collection Time: 05/23/17 10:45 AM  Result Value Ref Range Status   Adenovirus NOT DETECTED NOT DETECTED Final   Coronavirus 229E NOT DETECTED NOT DETECTED Final   Coronavirus HKU1 NOT DETECTED NOT DETECTED Final   Coronavirus NL63 NOT DETECTED NOT DETECTED Final   Coronavirus OC43 NOT DETECTED NOT DETECTED Final   Metapneumovirus NOT DETECTED NOT DETECTED Final   Rhinovirus / Enterovirus NOT DETECTED NOT DETECTED Final   Influenza A NOT DETECTED NOT DETECTED Final   Influenza B NOT DETECTED NOT DETECTED Final   Parainfluenza Virus 1 NOT DETECTED NOT DETECTED Final   Parainfluenza Virus 2 NOT DETECTED NOT DETECTED Final   Parainfluenza Virus 3 NOT DETECTED NOT DETECTED Final   Parainfluenza Virus 4 NOT DETECTED NOT DETECTED Final   Respiratory Syncytial Virus NOT DETECTED NOT DETECTED Final   Bordetella pertussis NOT DETECTED NOT DETECTED Final   Chlamydophila pneumoniae NOT DETECTED NOT DETECTED Final   Mycoplasma pneumoniae NOT DETECTED NOT DETECTED Final     Labs: BNP (last 3 results) No results for input(s): BNP in the last 8760 hours. Basic Metabolic Panel:  Recent Labs Lab 05/21/17 0229 05/24/17 0819 05/25/17 0453  NA 134* 139 139  K 3.2* 3.7 3.6  CL 99* 97* 99*  CO2 27 32 31  GLUCOSE 144* 108* 101*  BUN 11 11 12   CREATININE 0.88 0.82 0.72  CALCIUM 8.5* 8.8* 8.5*   Liver Function Tests: No results for input(s): AST, ALT, ALKPHOS, BILITOT, PROT, ALBUMIN in the last 168 hours. No results for input(s): LIPASE, AMYLASE in the last 168 hours. No results for input(s): AMMONIA in the last 168  hours. CBC:  Recent Labs Lab 05/21/17 0229 05/22/17 0738 05/24/17 0819 05/25/17 0453 05/26/17 0649  WBC 11.0* 12.9* 7.9 5.9 7.4  HGB 12.5* 13.5 11.9* 10.7* 10.8*  HCT 37.6* 41.1 37.3* 33.7* 34.0*  MCV 88.7 90.5 90.8 90.3 90.7   PLT 207 221 218 209 225   Cardiac Enzymes: No results for input(s): CKTOTAL, CKMB, CKMBINDEX, TROPONINI in the last 168 hours. BNP: Invalid input(s): POCBNP CBG:  Recent Labs Lab 05/25/17 1207 05/25/17 1721 05/25/17 2105 05/26/17 0818 05/26/17 1137  GLUCAP 124* 135* 112* 105* 167*   D-Dimer No results for input(s): DDIMER in the last 72 hours. Hgb A1c No results for input(s): HGBA1C in the last 72 hours. Lipid Profile No results for input(s): CHOL, HDL, LDLCALC, TRIG, CHOLHDL, LDLDIRECT in the last 72 hours. Thyroid function studies No results for input(s): TSH, T4TOTAL, T3FREE, THYROIDAB in the last 72 hours.  Invalid input(s): FREET3 Anemia work up No results for input(s): VITAMINB12, FOLATE, FERRITIN, TIBC, IRON, RETICCTPCT in the last 72 hours. Urinalysis No results found for: COLORURINE, APPEARANCEUR, LABSPEC, PHURINE, GLUCOSEU, HGBUR, BILIRUBINUR, KETONESUR, PROTEINUR, UROBILINOGEN, NITRITE, LEUKOCYTESUR Sepsis Labs Invalid input(s): PROCALCITONIN,  WBC,  LACTICIDVEN Microbiology Recent Results (from the past 240 hour(s))  Culture, blood (routine x 2) Call MD if unable to obtain prior to antibiotics being given     Status: None   Collection Time: 05/21/17  2:29 AM  Result Value Ref Range Status   Specimen Description BLOOD LEFT ARM  Final   Special Requests IN PEDIATRIC BOTTLE Blood Culture adequate volume  Final   Culture NO GROWTH 5 DAYS  Final   Report Status 05/26/2017 FINAL  Final  Culture, blood (routine x 2) Call MD if unable to obtain prior to antibiotics being given     Status: None   Collection Time: 05/21/17  2:33 AM  Result Value Ref Range Status   Specimen Description BLOOD RIGHT ARM  Final   Special Requests IN PEDIATRIC BOTTLE Blood Culture adequate volume  Final   Culture NO GROWTH 5 DAYS  Final   Report Status 05/26/2017 FINAL  Final  Culture, sputum-assessment     Status: None   Collection Time: 05/21/17 12:00 PM  Result Value Ref Range  Status   Specimen Description SPUTUM  Final   Special Requests NONE  Final   Sputum evaluation THIS SPECIMEN IS ACCEPTABLE FOR SPUTUM CULTURE  Final   Report Status 05/21/2017 FINAL  Final  Culture, respiratory (NON-Expectorated)     Status: None   Collection Time: 05/21/17 12:00 PM  Result Value Ref Range Status   Specimen Description SPUTUM  Final   Special Requests NONE Reflexed from [id number]  Final   Gram Stain   Final    MODERATE WBC PRESENT, PREDOMINANTLY PMN FEW SQUAMOUS EPITHELIAL CELLS PRESENT RARE GRAM POSITIVE COCCI    Culture RARE STAPHYLOCOCCUS AUREUS  Final   Report Status 05/24/2017 FINAL  Final   Organism ID, Bacteria STAPHYLOCOCCUS AUREUS  Final      Susceptibility   Staphylococcus aureus - MIC*    CIPROFLOXACIN <=0.5 SENSITIVE Sensitive     ERYTHROMYCIN <=0.25 SENSITIVE Sensitive     GENTAMICIN <=0.5 SENSITIVE Sensitive     OXACILLIN 0.5 SENSITIVE Sensitive     TETRACYCLINE <=1 SENSITIVE Sensitive     VANCOMYCIN <=0.5 SENSITIVE Sensitive     TRIMETH/SULFA <=10 SENSITIVE Sensitive     CLINDAMYCIN <=0.25 SENSITIVE Sensitive     RIFAMPIN <=0.5 SENSITIVE Sensitive     Inducible Clindamycin NEGATIVE Sensitive     *  RARE STAPHYLOCOCCUS AUREUS  Culture, expectorated sputum-assessment     Status: None   Collection Time: 05/21/17  1:00 PM  Result Value Ref Range Status   Specimen Description EXPECTORATED SPUTUM  Final   Special Requests NONE  Final   Sputum evaluation THIS SPECIMEN IS ACCEPTABLE FOR SPUTUM CULTURE  Final   Report Status 05/22/2017 FINAL  Final  Culture, respiratory (NON-Expectorated)     Status: None   Collection Time: 05/21/17  1:00 PM  Result Value Ref Range Status   Specimen Description EXPECTORATED SPUTUM  Final   Special Requests NONE Reflexed from [id number]  Final   Gram Stain   Final    MODERATE WBC PRESENT, PREDOMINANTLY PMN FEW SQUAMOUS EPITHELIAL CELLS PRESENT FEW GRAM POSITIVE COCCI IN CLUSTERS FEW GRAM NEGATIVE RODS    Culture MODERATE  Consistent with normal respiratory flora.  Final   Report Status 05/25/2017 FINAL  Final  Respiratory Panel by PCR     Status: None   Collection Time: 05/23/17 10:45 AM  Result Value Ref Range Status   Adenovirus NOT DETECTED NOT DETECTED Final   Coronavirus 229E NOT DETECTED NOT DETECTED Final   Coronavirus HKU1 NOT DETECTED NOT DETECTED Final   Coronavirus NL63 NOT DETECTED NOT DETECTED Final   Coronavirus OC43 NOT DETECTED NOT DETECTED Final   Metapneumovirus NOT DETECTED NOT DETECTED Final   Rhinovirus / Enterovirus NOT DETECTED NOT DETECTED Final   Influenza A NOT DETECTED NOT DETECTED Final   Influenza B NOT DETECTED NOT DETECTED Final   Parainfluenza Virus 1 NOT DETECTED NOT DETECTED Final   Parainfluenza Virus 2 NOT DETECTED NOT DETECTED Final   Parainfluenza Virus 3 NOT DETECTED NOT DETECTED Final   Parainfluenza Virus 4 NOT DETECTED NOT DETECTED Final   Respiratory Syncytial Virus NOT DETECTED NOT DETECTED Final   Bordetella pertussis NOT DETECTED NOT DETECTED Final   Chlamydophila pneumoniae NOT DETECTED NOT DETECTED Final   Mycoplasma pneumoniae NOT DETECTED NOT DETECTED Final     Time coordinating discharge: 40 minutes  SIGNED:  Noralee StainJennifer Florrie Ramires, DO [facility] Pager [phone]  If 7PM-7AM, please contact night-coverage [email or web address] Password [email or web address] 05/26/2017, 2:56 PM

## 2017-05-26 NOTE — Care Management Note (Signed)
Case Management Note  Patient Details  Name: Joel Santos MRN: [id number] Date of Birth: 1946/01/06  Subjective/Objective:                  Admitted with hemoptysis, history significant for chronic [facility] [place] remote history of CVA and cerebral sinus thrombosis on Coumadin, hypertension, and diabetes. From home with wife. Independent with ADL's on home oxygen.  PCP: Quintin AltoSteven Burdine  Action/Plan:  Discharge to home with home health services. CM explained to pt start of care with agency will begin on Tuesday and pt was ok with plan.  Expected Discharge Date:  05/26/17               Expected Discharge Plan:  Home w Home Health Services  In-House Referral:     Discharge planning Services  CM Consult  Post Acute Care Choice:    Choice offered to:  Patient  DME Arranged:    DME Agency:     HH Arranged:  RN, PT, OT, Nurse's Aide [facility] Agency:  Other - See comment [facility] [place], [phone], fax [phone])  Status of Service:  Completed, signed off  If discussed at Long Length of Stay Meetings, dates discussed:    Additional Comments:  Epifanio LeschesCole, Amorina Doerr Hudson, RN 05/26/2017, 4:37 PM

## 2017-05-26 NOTE — Care Management Important Message (Signed)
Important Message  Patient Details  Name: Joel Santos MRN: [id number] Date of Birth: 11-07-1945   Medicare Important Message Given:  Yes    Joel BalzarineShealy, [name] 05/26/2017, 10:28 AM

## 2017-05-31 ENCOUNTER — Ambulatory Visit: Payer: Medicare PPO | Admitting: Pulmonary Disease

## 2017-06-07 ENCOUNTER — Telehealth: Payer: Self-pay

## 2017-06-07 ENCOUNTER — Inpatient Hospital Stay: Payer: Medicare PPO | Admitting: Pulmonary Disease

## 2017-06-07 NOTE — Telephone Encounter (Signed)
lmtcb x1 to reschedule pt's HFU visit from 06/07/17 per PM, due to NS. PM would like for pt to f/u due to dx.

## 2017-06-09 NOTE — Telephone Encounter (Signed)
lmtcb x2 for pt. 

## 2017-06-20 NOTE — Telephone Encounter (Signed)
Called patient to reschedule HFU appt and pt is currently still admitted to the hospital. Pt stated he would call and make appt when he was discharged.

## 2017-06-26 NOTE — Telephone Encounter (Signed)
Called to schedule HFU. Spoke to pt's spouse, who states pt passed away yesterday.  I have called medical records and requested that pt's chart be marked as decease.  Nothing further needed.

## 2017-07-01 DEATH — deceased

## 2018-03-17 IMAGING — DX DG CHEST 2V
2 series · 2 of 2 positions shown · non-contrast
Comparison: No prior .

CLINICAL DATA: Hemoptysis.  Pulmonary fibrosis.

EXAM:
CHEST  2 VIEW

[chest lat]
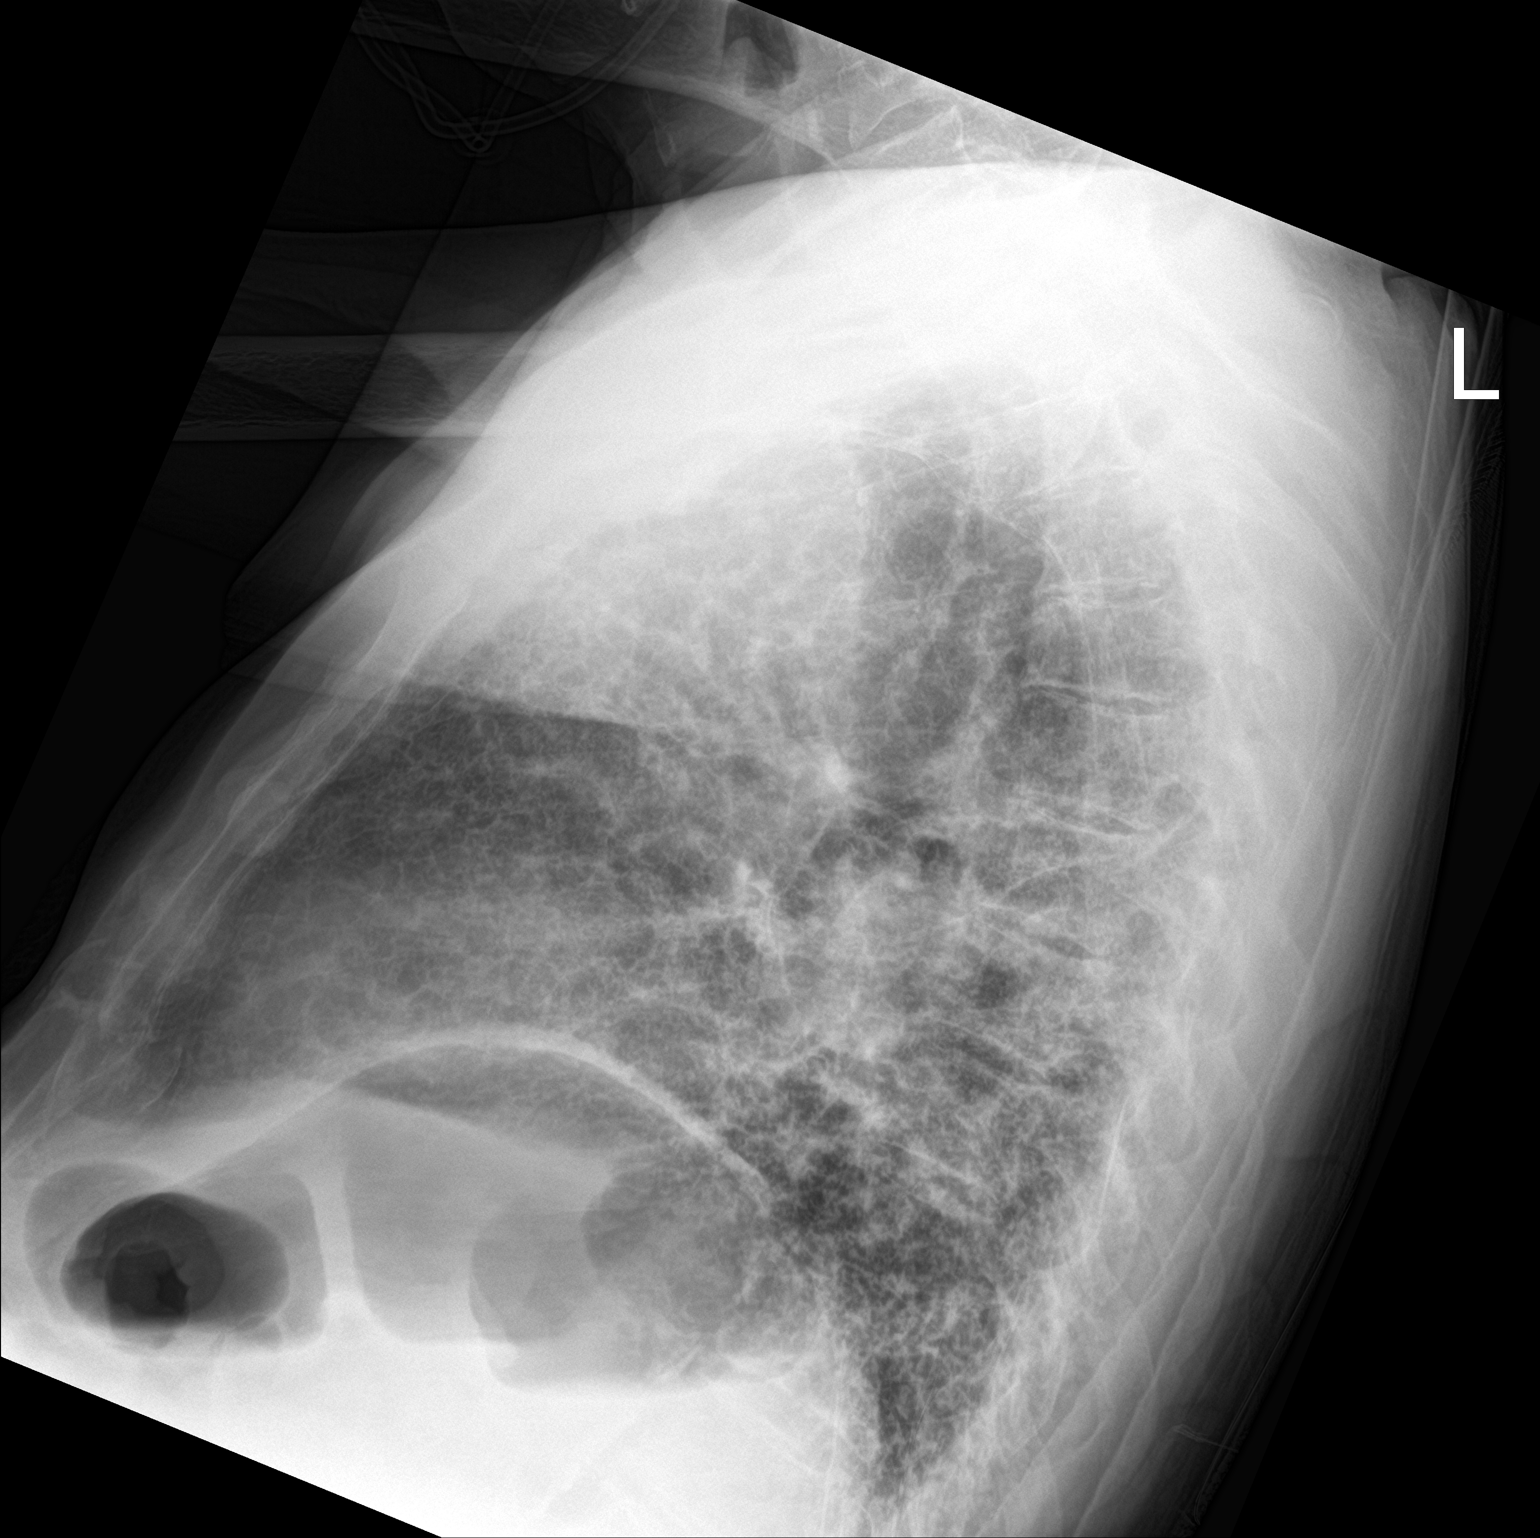

[chest ap]
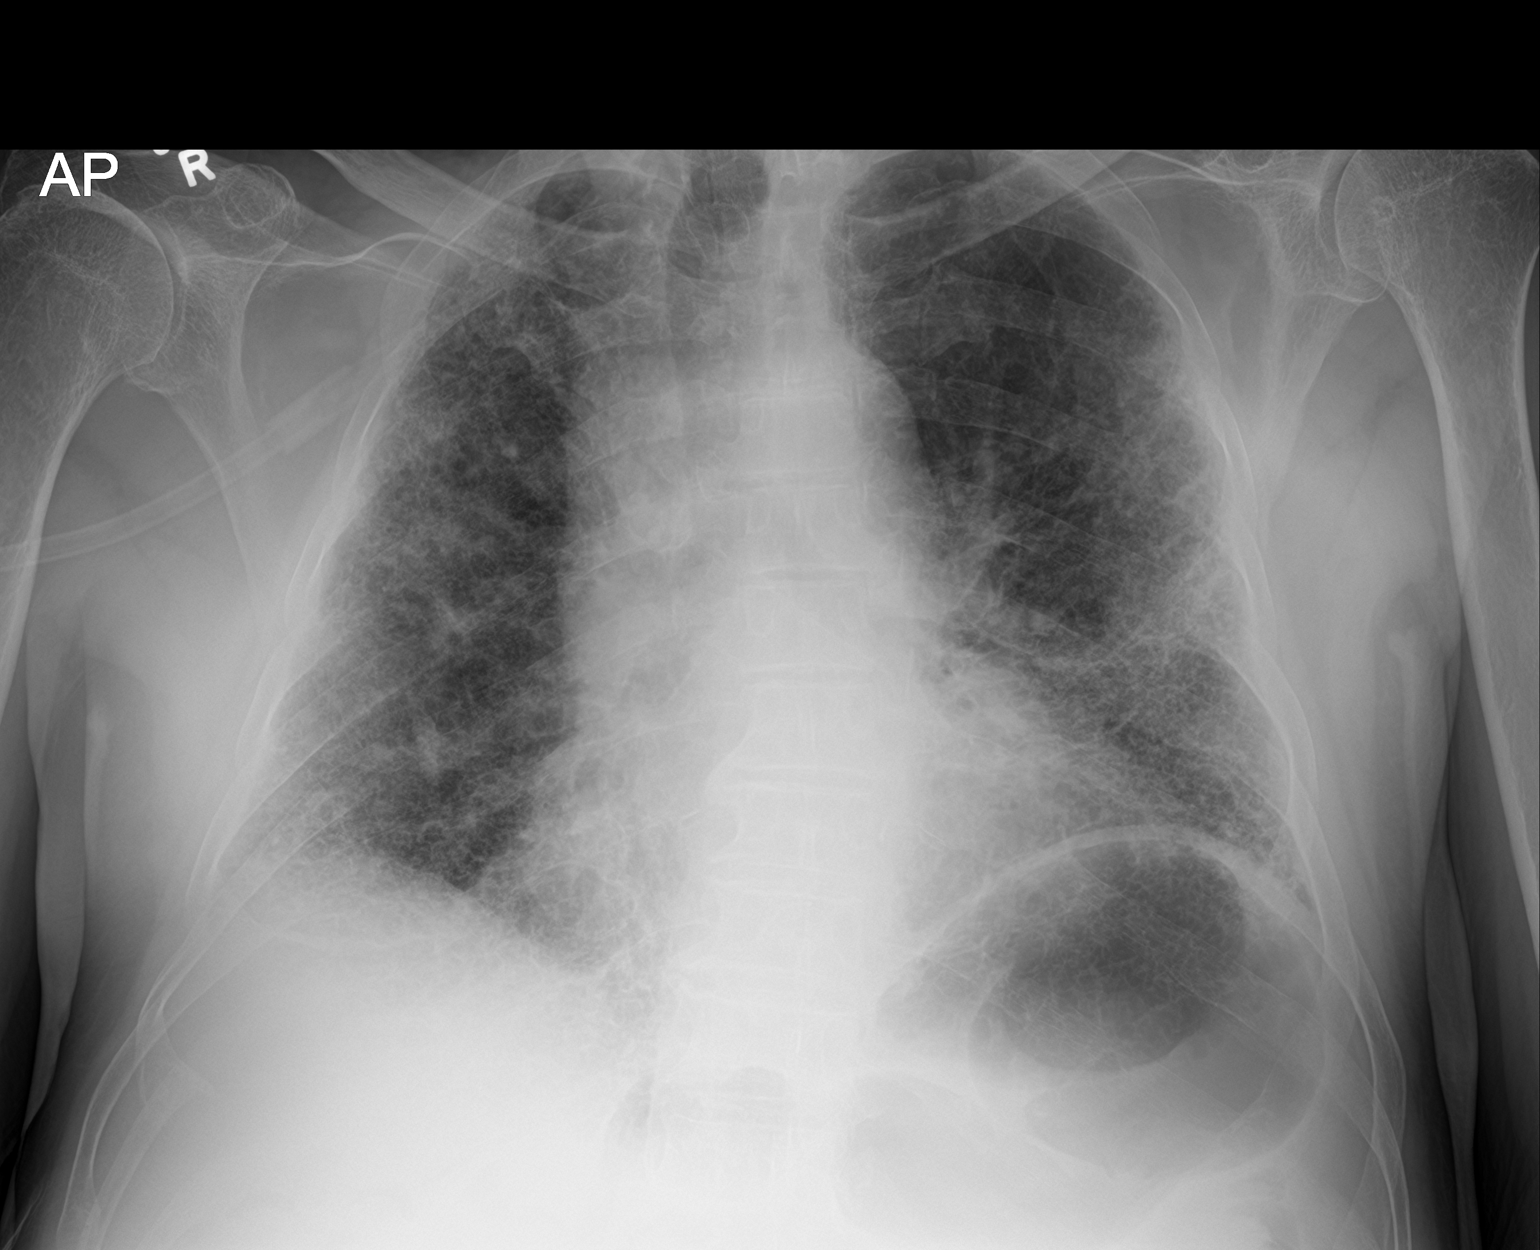

[2 of 2 positions shown; findings below may reference images not displayed]

FINDINGS: Mediastinum and hilar structures are normal. Cardiomegaly. No
pulmonary venous congestion . Diffuse interstitial prominence is
noted bilaterally most consistent chronic interstitial lung disease
given patient's history. Overlying active interstitial process
cannot be excluded. No pleural effusion or pneumothorax.
IMPRESSION: 1. Diffuse bilateral pulmonary interstitial prominence most likely
chronic interstitial lung disease given the patient's history.
Overlying active interstitial process cannot be excluded.

2. Cardiomegaly.  No pulmonary venous congestion.
